# Patient Record
Sex: Female | Born: 1980
Health system: Southern US, Community
[De-identification: ages and names within clinical notes are randomized; demographics above are authoritative.]

## PROBLEM LIST (undated history)

## (undated) DIAGNOSIS — J40 Bronchitis, not specified as acute or chronic: Secondary | ICD-10-CM

## (undated) DIAGNOSIS — R011 Cardiac murmur, unspecified: Secondary | ICD-10-CM

## (undated) DIAGNOSIS — M1990 Unspecified osteoarthritis, unspecified site: Secondary | ICD-10-CM

## (undated) HISTORY — DX: Cardiac murmur, unspecified: R01.1

## (undated) HISTORY — PX: KNEE SURGERY: SHX244

## (undated) HISTORY — PX: KNEE ARTHROSCOPY WITH LATERAL MENISECTOMY: SHX6193

## (undated) HISTORY — DX: Unspecified osteoarthritis, unspecified site: M19.90

---

## 2006-11-06 HISTORY — PX: ARTHROSCOPIC REPAIR ACL: SUR80

## 2013-10-06 ENCOUNTER — Encounter (HOSPITAL_COMMUNITY): Payer: Self-pay | Admitting: Emergency Medicine

## 2013-10-06 ENCOUNTER — Emergency Department (HOSPITAL_COMMUNITY)

## 2013-10-06 ENCOUNTER — Emergency Department (INDEPENDENT_AMBULATORY_CARE_PROVIDER_SITE_OTHER)
Admission: EM | Admit: 2013-10-06 | Discharge: 2013-10-06 | Disposition: A | Discharge status: Home / Self Care | Source: Home / Self Care | Attending: Family Medicine | Admitting: Family Medicine

## 2013-10-06 ENCOUNTER — Emergency Department (HOSPITAL_COMMUNITY)
Admission: EM | Admit: 2013-10-06 | Discharge: 2013-10-07 | Disposition: A | Discharge status: Home / Self Care | Attending: Emergency Medicine | Admitting: Emergency Medicine

## 2013-10-06 DIAGNOSIS — Z79899 Other long term (current) drug therapy: Secondary | ICD-10-CM | POA: Insufficient documentation

## 2013-10-06 DIAGNOSIS — J069 Acute upper respiratory infection, unspecified: Secondary | ICD-10-CM

## 2013-10-06 DIAGNOSIS — R51 Headache: Secondary | ICD-10-CM | POA: Insufficient documentation

## 2013-10-06 HISTORY — DX: Bronchitis, not specified as acute or chronic: J40

## 2013-10-06 MED ORDER — FLUTICASONE PROPIONATE 50 MCG/ACT NA SUSP: 2.0000 | Freq: Every day | NASAL | Status: DC

## 2013-10-06 MED ORDER — HYDROCODONE-HOMATROPINE 5-1.5 MG/5ML PO SYRP: 5.0000 mL | ORAL_SOLUTION | Freq: Four times a day (QID) | ORAL | Status: DC | PRN

## 2013-10-06 MED ORDER — HYDROCOD POLST-CHLORPHEN POLST 10-8 MG/5ML PO LQCR: 5.0000 mL | Freq: Two times a day (BID) | ORAL | Status: DC | PRN

## 2013-10-06 MED ORDER — IPRATROPIUM BROMIDE 0.06 % NA SOLN: 2.0000 | Freq: Four times a day (QID) | NASAL | Status: DC

## 2013-10-06 NOTE — ED Notes (Signed)
Pt has been having a cough for 3 weeks said that she just left urgent care and waited 2 hours to bee seen. Hx of bronchitis. Said her daughter has recently dx with whooping cough.

## 2013-10-06 NOTE — ED Notes (Signed)
Patient room empty, did not speak to staff when leaving

## 2013-10-06 NOTE — ED Provider Notes (Signed)
CSN: [id number]     Arrival date & time 10/06/13  2108 History   First MD Initiated Contact with Patient 10/06/13 2249     This chart was scribed for non-physician practitioner working with Ward Givens, MD by Arlan Organ, ED Scribe. This patient was seen in room WTR5/WTR5 and the patient's care was started at 11:33 PM.   Chief Complaint  Patient presents with  . Cough   The history is provided by the patient. No language interpreter was used.   HPI Comments: Christina Davies is a 32 y.o. Female with a h/o bronchitis who presents to the Emergency Department complaining of a gradual onset, unchanged, new, mild non-productive cough that started 3 weeks ago. She also reports associated chest congestion, rhinorrhea and intermittent frontal sinus HA. Pt states both of her children have been sick in the last few weeks with upper respiratory symptoms. She states she experiences some mild dyspnea after coughing spells. She denies chills, fever, chest pain, syncope, vomiting, and shortness of breath.  Past Medical History  Diagnosis Date  . Bronchitis    Past Surgical History  Procedure Laterality Date  . Cesarean section    . Knee surgery Left    No family history on file. History  Substance Use Topics  . Smoking status: Never Smoker   . Smokeless tobacco: Not on file  . Alcohol Use: No   OB History   Grav Para Term Preterm Abortions TAB SAB Ect Mult Living                  Review of Systems  Respiratory: Positive for cough.   All other systems reviewed and are negative.    Allergies  Review of patient's allergies indicates no known allergies.  Home Medications   Current Outpatient Rx  Name  Route  Sig  Dispense  Refill  . Pseudoephedrine-DM-GG (ROBITUSSIN COLD & COUGH PO)   Oral   Take 10 mLs by mouth every 6 (six) hours as needed. For cough         . chlorpheniramine-HYDROcodone (TUSSIONEX PENNKINETIC ER) 10-8 MG/5ML LQCR   Oral   Take 5 mLs by mouth every 12 (twelve)  hours as needed for cough.   115 mL   1   . fluticasone (FLONASE) 50 MCG/ACT nasal spray   Each Nare   Place 2 sprays into both nostrils daily.   16 g   0   . HYDROcodone-homatropine (HYCODAN) 5-1.5 MG/5ML syrup   Oral   Take 5 mLs by mouth every 6 (six) hours as needed for cough.   120 mL   0   . ipratropium (ATROVENT) 0.06 % nasal spray   Nasal   Place 2 sprays into the nose 4 (four) times daily.   15 mL   1    Triage Vitals: BP 135/89  Pulse 95  Temp(Src) 98.6 F (37 C) (Oral)  SpO2 98%  LMP 09/27/2013  Physical Exam  Nursing note and vitals reviewed. Constitutional: She is oriented to person, place, and time. She appears well-developed and well-nourished. No distress.  HENT:  Head: Normocephalic and atraumatic.  Mouth/Throat: Oropharynx is clear and moist. No oropharyngeal exudate.  Eyes: Conjunctivae and EOM are normal. Pupils are equal, round, and reactive to light. No scleral icterus.  Neck: Normal range of motion.  Cardiovascular: Normal rate, regular rhythm and normal heart sounds.   Pulmonary/Chest: Effort normal and breath sounds normal. No respiratory distress. She has no wheezes. She has no rales.  No retractions or accessory muscle use. Symmetric chest expansion.  Musculoskeletal: Normal range of motion.  Neurological: She is alert and oriented to person, place, and time.  Skin: Skin is warm and dry. No rash noted. She is not diaphoretic. No erythema. No pallor.  Psychiatric: She has a normal mood and affect. Her behavior is normal.    ED Course  Procedures (including critical care time)  DIAGNOSTIC STUDIES: Oxygen Saturation is 98% on RA, Normal by my interpretation.    COORDINATION OF CARE: 11:20 PM- Will order chest X-Ray. Discussed treatment plan with pt at bedside and pt agreed to plan.     Labs Review Labs Reviewed - No data to display  Imaging Review Dg Chest 2 View  10/06/2013   CLINICAL DATA:  Cough and congestion  EXAM: CHEST  2  VIEW  COMPARISON:  None.  FINDINGS: The heart size and mediastinal contours are within normal limits. Both lungs are clear. The visualized skeletal structures are unremarkable.  IMPRESSION: No active cardiopulmonary disease.   Electronically Signed   By: Esperanza Heir M.D.   On: 10/06/2013 22:33    EKG Interpretation   None       MDM   1. Viral URI with cough    32 year old female who presents for viral upper respiratory symptoms with cough x3 weeks. Patient well and nontoxic appearing, hemodynamically stable, and afebrile. Heart regular rate and rhythm and lungs clear bilaterally. No retractions or accessory muscle use appreciated. Chest expansion is symmetric. Chest x-ray today negative for pneumonia or bronchitis; no other acute cardiopulmonary process identified. Patient stable for discharge with primary care followup for further evaluation of her symptoms as needed. Will prescribe Hycodan and Flonase for symptomatic management. Return precautions discussed and patient agreeable to plan with no unaddressed concerns.  I personally performed the services described in this documentation, which was scribed in my presence. The recorded information has been reviewed and is accurate.    Antony Madura, PA-C 10/15/13 [phone]

## 2013-10-06 NOTE — ED Notes (Signed)
Patient is resting comfortably. 

## 2013-10-06 NOTE — ED Notes (Signed)
Toddler son being seen in the same treatment room

## 2013-10-06 NOTE — ED Provider Notes (Signed)
CSN: [id number]     Arrival date & time 10/06/13  1904 History   First MD Initiated Contact with Patient 10/06/13 2036     Chief Complaint  Patient presents with  . Cough  . Otalgia   (Consider location/radiation/quality/duration/timing/severity/associated sxs/prior Treatment) Patient is a 32 y.o. female presenting with cough. The history is provided by the patient.  Cough Cough characteristics:  Non-productive and dry Severity:  Mild Onset quality:  Gradual Duration:  3 weeks Progression:  Unchanged Chronicity:  New Smoker: no   Context: sick contacts and upper respiratory infection   Context comment:  Son similarly sick. Associated symptoms: rhinorrhea   Associated symptoms: no chills, no fever and no shortness of breath     History reviewed. No pertinent past medical history. Past Surgical History  Procedure Laterality Date  . Cesarean section    . Knee surgery Left    No family history on file. History  Substance Use Topics  . Smoking status: Never Smoker   . Smokeless tobacco: Not on file  . Alcohol Use: No   OB History   Grav Para Term Preterm Abortions TAB SAB Ect Mult Living                 Review of Systems  Constitutional: Negative.  Negative for fever and chills.  HENT: Positive for congestion and rhinorrhea.   Eyes: Negative.   Respiratory: Positive for cough. Negative for shortness of breath.   Cardiovascular: Negative.   Gastrointestinal: Negative.   Genitourinary: Negative.     Allergies  Review of patient's allergies indicates no known allergies.  Home Medications   Current Outpatient Rx  Name  Route  Sig  Dispense  Refill  . OVER THE COUNTER MEDICATION      Cough syrup         . chlorpheniramine-HYDROcodone (TUSSIONEX PENNKINETIC ER) 10-8 MG/5ML LQCR   Oral   Take 5 mLs by mouth every 12 (twelve) hours as needed for cough.   115 mL   1   . ipratropium (ATROVENT) 0.06 % nasal spray   Nasal   Place 2 sprays into the nose 4 (four)  times daily.   15 mL   1    BP 131/77  Pulse 98  Temp(Src) 98 F (36.7 C) (Oral)  Resp 20  SpO2 98%  LMP 09/27/2013 Physical Exam  Nursing note and vitals reviewed. Constitutional: She is oriented to person, place, and time. She appears well-developed and well-nourished. No distress.  HENT:  Head: Normocephalic.  Right Ear: External ear normal.  Left Ear: External ear normal.  Mouth/Throat: Oropharynx is clear and moist.  Eyes: Conjunctivae are normal. Pupils are equal, round, and reactive to light.  Neck: Normal range of motion. Neck supple.  Cardiovascular: Normal rate, regular rhythm, normal heart sounds and intact distal pulses.   Pulmonary/Chest: Effort normal and breath sounds normal.  Abdominal: Soft. Bowel sounds are normal. There is no tenderness.  Lymphadenopathy:    She has no cervical adenopathy.  Neurological: She is alert and oriented to person, place, and time.  Skin: Skin is warm and dry.    ED Course  Procedures (including critical care time) Labs Review Labs Reviewed - No data to display Imaging Review No results found.  EKG Interpretation    Date/Time:    Ventricular Rate:    PR Interval:    QRS Duration:   QT Interval:    QTC Calculation:   R Axis:     Text Interpretation:  MDM      Linna Hoff, MD 10/06/13 [phone]

## 2013-10-06 NOTE — ED Notes (Signed)
Coughing, vomiting, ear hurting.  Onset 3 weeks ago.

## 2013-10-15 NOTE — ED Provider Notes (Signed)
Medical screening examination/treatment/procedure(s) were performed by non-physician practitioner and as supervising physician I was immediately available for consultation/collaboration.  EKG Interpretation   None       Devoria Albe, MD, Armando Gang   Ward Givens, MD 10/15/13 [phone]

## 2014-04-25 ENCOUNTER — Ambulatory Visit (INDEPENDENT_AMBULATORY_CARE_PROVIDER_SITE_OTHER): Admitting: Emergency Medicine

## 2014-04-25 VITALS — BP 112/72 | HR 74 | Temp 98.3°F | Resp 18 | Ht 65.0 in | Wt 155.0 lb

## 2014-04-25 DIAGNOSIS — R300 Dysuria: Secondary | ICD-10-CM

## 2014-04-25 DIAGNOSIS — J018 Other acute sinusitis: Secondary | ICD-10-CM

## 2014-04-25 DIAGNOSIS — N3000 Acute cystitis without hematuria: Secondary | ICD-10-CM

## 2014-04-25 LAB — POCT UA - MICROSCOPIC ONLY
CASTS, UR, LPF, POC: NEGATIVE
Crystals, Ur, HPF, POC: NEGATIVE
EPITHELIAL CELLS, URINE PER MICROSCOPY: NEGATIVE
Mucus, UA: NEGATIVE
YEAST UA: NEGATIVE

## 2014-04-25 LAB — POCT URINALYSIS DIPSTICK
Bilirubin, UA: NEGATIVE
Glucose, UA: NEGATIVE
Ketones, UA: NEGATIVE
Nitrite, UA: NEGATIVE
PH UA: 7
Protein, UA: 30
Spec Grav, UA: 1.015
UROBILINOGEN UA: 0.2

## 2014-04-25 MED ORDER — PSEUDOEPHEDRINE-GUAIFENESIN ER 60-600 MG PO TB12: 1.0000 | ORAL_TABLET | Freq: Two times a day (BID) | ORAL | Status: DC

## 2014-04-25 MED ORDER — PROMETHAZINE-CODEINE 6.25-10 MG/5ML PO SYRP: 5.0000 mL | ORAL_SOLUTION | Freq: Four times a day (QID) | ORAL | Status: DC | PRN

## 2014-04-25 MED ORDER — LEVOFLOXACIN 500 MG PO TABS: 500.0000 mg | ORAL_TABLET | Freq: Every day | ORAL | Status: AC

## 2014-04-25 NOTE — Progress Notes (Signed)
[facility] [address], [place] [address] [phone]  Date:  04/25/2014   Name:  Christina DailyMelody H Davies   DOB:  06/14/1981   MRN:  [id number]  PCP:  Mia CreekALBOT, DAVID C, MD    Chief Complaint: Back Pain, burning with urination and cough and congestion   History of Present Illness:  Christina Davies is a 33 y.o. very pleasant female patient who presents with the following:  1 week history of nasal congestion and post nasal drip.  Has a cough that is largely not productive.  No wheezing or shortness of breath.  No nausea or vomiting.  No stool change or rash.  No fever or chills.  Some dizziness at times.  History of sinus infections.  Has lower back pressure and dysuria, urgency and frequency. No vaginal discharge or bleeding.  No improvement with over the counter medications or other home remedies. Denies other complaint or health concern today.   There are no active problems to display for this patient.   Past Medical History  Diagnosis Date  . Bronchitis   . Arthritis   . Heart murmur     Past Surgical History  Procedure Laterality Date  . Cesarean section    . Knee surgery Left     History  Substance Use Topics  . Smoking status: Never Smoker   . Smokeless tobacco: Not on file  . Alcohol Use: No    Family History  Problem Relation Age of Onset  . Hypertension Mother   . Hypertension Father   . Hypertension Maternal Grandmother   . Hypertension Maternal Grandfather   . Hypertension Paternal Grandmother   . Cancer Paternal Grandmother   . Heart disease Paternal Grandmother   . Hypertension Paternal Grandfather     Allergies  Allergen Reactions  . Latex     Medication list has been reviewed and updated.  Current Outpatient Prescriptions on File Prior to Visit  Medication Sig Dispense Refill  . chlorpheniramine-HYDROcodone (TUSSIONEX PENNKINETIC ER) 10-8 MG/5ML LQCR Take 5 mLs by mouth every 12 (twelve) hours as needed for cough.  115 mL   1  . fluticasone (FLONASE) 50 MCG/ACT nasal spray Place 2 sprays into both nostrils Davies.  16 g  0  . HYDROcodone-homatropine (HYCODAN) 5-1.5 MG/5ML syrup Take 5 mLs by mouth every 6 (six) hours as needed for cough.  120 mL  0  . ipratropium (ATROVENT) 0.06 % nasal spray Place 2 sprays into the nose 4 (four) times Davies.  15 mL  1  . Pseudoephedrine-DM-GG (ROBITUSSIN COLD & COUGH PO) Take 10 mLs by mouth every 6 (six) hours as needed. For cough       No current facility-administered medications on file prior to visit.    Review of Systems:  As per HPI, otherwise negative.    Physical Examination: Filed Vitals:   04/25/14 1450  BP: 112/72  Pulse: 74  Temp: 98.3 F (36.8 C)  Resp: 18   Filed Vitals:   04/25/14 1450  Height: 5\' 5"  (1.651 m)  Weight: 155 lb (70.308 kg)   Body mass index is 25.79 kg/(m^2). Ideal Body Weight: Weight in (lb) to have BMI = 25: 149.9  GEN: WDWN, NAD, Non-toxic, A & O x 3 HEENT: Atraumatic, Normocephalic. Neck supple. No masses, No LAD. Ears and Nose: No external deformity. CV: RRR, No M/G/R. No JVD. No thrill. No extra heart sounds. PULM: CTA B, no wheezes, crackles, rhonchi. No retractions. No resp. distress.  No accessory muscle use. ABD: S, NT, ND, +BS. No rebound. No HSM. EXTR: No c/c/e NEURO Normal gait.  PSYCH: Normally interactive. Conversant. Not depressed or anxious appearing.  Calm demeanor.    Assessment and Plan: Sinusitis cystitis levaquin  Signed,  Phillips OdorJeffery Anderson, MD   Results for orders placed in visit on 04/25/14  POCT URINALYSIS DIPSTICK      Result Value Ref Range   Color, UA bright yellow     Clarity, UA slightly cloudy     Glucose, UA neg     Bilirubin, UA neg     Ketones, UA neg     Spec Grav, UA 1.015     Blood, UA trace-intact     pH, UA 7.0     Protein, UA 30     Urobilinogen, UA 0.2     Nitrite, UA neg     Leukocytes, UA small (1+)    POCT UA - MICROSCOPIC ONLY      Result Value Ref Range   WBC,  Ur, HPF, POC 3-5     RBC, urine, microscopic 3-5     Bacteria, U Microscopic trace     Mucus, UA neg     Epithelial cells, urine per micros neg     Crystals, Ur, HPF, POC neg     Casts, Ur, LPF, POC neg     Yeast, UA neg

## 2014-04-25 NOTE — Patient Instructions (Signed)
Sinusitis Sinusitis is redness, soreness, and swelling (inflammation) of the paranasal sinuses. Paranasal sinuses are air pockets within the bones of your face (beneath the eyes, the middle of the forehead, or above the eyes). In healthy paranasal sinuses, mucus is able to drain out, and air is able to circulate through them by way of your nose. However, when your paranasal sinuses are inflamed, mucus and air can become trapped. This can allow bacteria and other germs to grow and cause infection. Sinusitis can develop quickly and last only a short time (acute) or continue over a long period (chronic). Sinusitis that lasts for more than 12 weeks is considered chronic.  CAUSES  Causes of sinusitis include:  Allergies.  Structural abnormalities, such as displacement of the cartilage that separates your nostrils (deviated septum), which can decrease the air flow through your nose and sinuses and affect sinus drainage.  Functional abnormalities, such as when the small hairs (cilia) that line your sinuses and help remove mucus do not work properly or are not present. SYMPTOMS  Symptoms of acute and chronic sinusitis are the same. The primary symptoms are pain and pressure around the affected sinuses. Other symptoms include:  Upper toothache.  Earache.  Headache.  Bad breath.  Decreased sense of smell and taste.  A cough, which worsens when you are lying flat.  Fatigue.  Fever.  Thick drainage from your nose, which often is green and may contain pus (purulent).  Swelling and warmth over the affected sinuses. DIAGNOSIS  Your caregiver will perform a physical exam. During the exam, your caregiver may:  Look in your nose for signs of abnormal growths in your nostrils (nasal polyps).  Tap over the affected sinus to check for signs of infection.  View the inside of your sinuses (endoscopy) with a special imaging device with a light attached (endoscope), which is inserted into your  sinuses. If your caregiver suspects that you have chronic sinusitis, one or more of the following tests may be recommended:  Allergy tests.  Nasal culture--A sample of mucus is taken from your nose and sent to a lab and screened for bacteria.  Nasal cytology--A sample of mucus is taken from your nose and examined by your caregiver to determine if your sinusitis is related to an allergy. TREATMENT  Most cases of acute sinusitis are related to a viral infection and will resolve on their own within 10 days. Sometimes medicines are prescribed to help relieve symptoms (pain medicine, decongestants, nasal steroid sprays, or saline sprays).  However, for sinusitis related to a bacterial infection, your caregiver will prescribe antibiotic medicines. These are medicines that will help kill the bacteria causing the infection.  Rarely, sinusitis is caused by a fungal infection. In theses cases, your caregiver will prescribe antifungal medicine. For some cases of chronic sinusitis, surgery is needed. Generally, these are cases in which sinusitis recurs more than 3 times per year, despite other treatments. HOME CARE INSTRUCTIONS   Drink plenty of water. Water helps thin the mucus so your sinuses can drain more easily.  Use a humidifier.  Inhale steam 3 to 4 times a day (for example, sit in the bathroom with the shower running).  Apply a warm, moist washcloth to your face 3 to 4 times a day, or as directed by your caregiver.  Use saline nasal sprays to help moisten and clean your sinuses.  Take over-the-counter or prescription medicines for pain, discomfort, or fever only as directed by your caregiver. SEEK IMMEDIATE MEDICAL CARE IF:    You have increasing pain or severe headaches.  You have nausea, vomiting, or drowsiness.  You have swelling around your face.  You have vision problems.  You have a stiff neck.  You have difficulty breathing. MAKE SURE YOU:   Understand these  instructions.  Will watch your condition.  Will get help right away if you are not doing well or get worse. Document Released: 10/23/2005 Document Revised: 01/15/2012 Document Reviewed: 11/07/2011 Exit[employer] Patient Information 2015 [employer]. This information is not intended to replace advice given to you by your health care provider. Make sure you discuss any questions you have with your health care provider.  

## 2014-08-27 ENCOUNTER — Ambulatory Visit (INDEPENDENT_AMBULATORY_CARE_PROVIDER_SITE_OTHER): Admitting: Family Medicine

## 2014-08-27 VITALS — BP 128/82 | HR 75 | Temp 98.1°F | Resp 16 | Ht 65.5 in | Wt 171.8 lb

## 2014-08-27 DIAGNOSIS — N898 Other specified noninflammatory disorders of vagina: Secondary | ICD-10-CM

## 2014-08-27 DIAGNOSIS — R300 Dysuria: Secondary | ICD-10-CM

## 2014-08-27 LAB — POCT WET PREP WITH KOH
KOH Prep POC: NEGATIVE
TRICHOMONAS UA: NEGATIVE
Yeast Wet Prep HPF POC: NEGATIVE

## 2014-08-27 LAB — POCT URINALYSIS DIPSTICK
Bilirubin, UA: NEGATIVE
GLUCOSE UA: NEGATIVE
Ketones, UA: NEGATIVE
NITRITE UA: NEGATIVE
Protein, UA: NEGATIVE
RBC UA: NEGATIVE
Spec Grav, UA: 1.02
UROBILINOGEN UA: 0.2
pH, UA: 5.5

## 2014-08-27 LAB — POCT UA - MICROSCOPIC ONLY
CASTS, UR, LPF, POC: NEGATIVE
Crystals, Ur, HPF, POC: NEGATIVE
MUCUS UA: NEGATIVE
Yeast, UA: NEGATIVE

## 2014-08-27 MED ORDER — AZITHROMYCIN 250 MG PO TABS: ORAL_TABLET | ORAL | Status: DC

## 2014-08-27 MED ORDER — CEFTRIAXONE SODIUM 1 G IJ SOLR
500.0000 mg | Freq: Once | INTRAMUSCULAR | Status: AC
  Administered 2014-08-27: 500 mg via INTRAMUSCULAR

## 2014-08-27 NOTE — Progress Notes (Addendum)
Subjective:    Patient ID: Christina Davies, female    DOB: 12/05/1980, 33 y.o.   MRN: [id number]  This chart was scribed for Nilda SimmerKristi Clovia Reine, MD by Jarvis Morganaylor Ferguson, ED Scribe. This patient was seen in Room 8 and the patient's care was started at 8:37 PM.   08/27/2014  Dysuria and Vaginal Discharge   HPI HPI Comments: Christina Davies is a 33 y.o. female who presents to the [facility] complaining of a possible vaginal bacterial infection for 1 week. Pt states she has been having burning dysuria, yellow-green vaginal discharge, mild vaginal irritation, and vaginal bleeding that began today. She states the bleeding is unusual. She notes sometimes she has spotting but has not had the bleeding symptoms with prior vaginal infections.  Pt states yesterday she had a HA with some accompanied nausea but believes it to be unrelated to the vaginal symptoms. Pt is not married. She is dating and states she has a new sexual partner. She does not believe she has an STI. No prior h/o STIs. Her pap smear is UTD. She uses Mirena for her BC method. She had it implanted in April of this year. She denies any vaginal itching, hematuria, fever, vomiting, diarrhea, chills, flank pain.    Review of Systems  Constitutional: Negative for fever and chills.  Gastrointestinal: Positive for nausea. Negative for vomiting and diarrhea.  Genitourinary: Positive for dysuria, vaginal bleeding, vaginal discharge and vaginal pain (mild irritation). Negative for hematuria and flank pain.  Neurological: Positive for headaches.  All other systems reviewed and are negative.   Past Medical History  Diagnosis Date  . Bronchitis   . Arthritis   . Heart murmur    Past Surgical History  Procedure Laterality Date  . Cesarean section    . Knee surgery Left    Allergies  Allergen Reactions  . Latex    Current Outpatient Prescriptions  Medication Sig Dispense Refill  . Butalbital-APAP-Caffeine 50-300-40 MG  CAPS Take by mouth.      . levonorgestrel (MIRENA) 20 MCG/24HR IUD 1 each by Intrauterine route once.      Marland Kitchen. omeprazole (PRILOSEC) 20 MG capsule Take 20 mg by mouth daily.      . ondansetron (ZOFRAN) 4 MG tablet Take 4 mg by mouth every 8 (eight) hours as needed for nausea or vomiting.      Marland Kitchen. azithromycin (ZITHROMAX) 250 MG tablet 4 tablets po x 1  4 tablet  0  . doxycycline (VIBRAMYCIN) 100 MG capsule Take 1 capsule (100 mg total) by mouth 2 (two) times daily.  14 capsule  0   No current facility-administered medications for this visit.       Objective:    Triage Vitals: BP 128/82  Pulse 75  Temp(Src) 98.1 F (36.7 C) (Oral)  Resp 16  Ht 5' 5.5" (1.664 m)  Wt 171 lb 12.8 oz (77.928 kg)  BMI 28.14 kg/m2  SpO2 99%  Physical Exam  Nursing note and vitals reviewed. Constitutional: She is oriented to person, place, and time. She appears well-developed and well-nourished. No distress.  HENT:  Head: Normocephalic and atraumatic.  Eyes: Conjunctivae and EOM are normal.  Neck: Neck supple. No tracheal deviation present.  Cardiovascular: Normal rate.   Pulmonary/Chest: Effort normal. No respiratory distress.  Abdominal: Soft. Bowel sounds are normal. She exhibits no distension. There is no tenderness.  Genitourinary: Cervix exhibits no motion tenderness. Vaginal discharge found.  Moderate amount of yellow white discharge in vaginal  vault.  IUD strings were visualized  Musculoskeletal: Normal range of motion.  Neurological: She is alert and oriented to person, place, and time.  Skin: Skin is warm and dry.  Psychiatric: She has a normal mood and affect. Her behavior is normal.    ROCEPHIN 500MG  IM ADMINISTERED.     Assessment & Plan:   1. Dysuria   2. Discharge from the vagina     Meds ordered this encounter  Medications  . ondansetron (ZOFRAN) 4 MG tablet    Sig: Take 4 mg by mouth every 8 (eight) hours as needed for nausea or vomiting.  . Butalbital-APAP-Caffeine  50-300-40 MG CAPS    Sig: Take by mouth.  Marland Kitchen. azithromycin (ZITHROMAX) 250 MG tablet    Sig: 4 tablets po x 1    Dispense:  4 tablet    Refill:  0  . cefTRIAXone (ROCEPHIN) injection 500 mg    Sig:     Order Specific Question:  Antibiotic Indication:    Answer:  STD  . doxycycline (VIBRAMYCIN) 100 MG capsule    Sig: Take 1 capsule (100 mg total) by mouth 2 (two) times daily.    Dispense:  14 capsule    Refill:  0    No Follow-up on file.   I personally performed the services described in this documentation, which was scribed in my presence.  The recorded information has been reviewed and is accurate.  Nilda SimmerKristi Kendarrius Tanzi, M.D.  [facility]  [place] [address] [place]  [address] [phone] phone [phone] fax

## 2014-08-29 LAB — GC/CHLAMYDIA PROBE AMP
CT PROBE, AMP APTIMA: NEGATIVE
GC PROBE AMP APTIMA: NEGATIVE

## 2014-08-31 LAB — URINE CULTURE: Colony Count: >=100000

## 2014-09-01 ENCOUNTER — Telehealth: Payer: Self-pay | Admitting: Radiology

## 2014-09-01 MED ORDER — DOXYCYCLINE HYCLATE 100 MG PO CAPS: 100.0000 mg | ORAL_CAPSULE | Freq: Two times a day (BID) | ORAL | Status: DC

## 2014-09-01 NOTE — Telephone Encounter (Signed)
Pt calling about lab results. Have not been reviewed yet.

## 2014-09-01 NOTE — Telephone Encounter (Signed)
Labs reviewed and sent to lab pool to contact patient.

## 2015-06-20 ENCOUNTER — Ambulatory Visit (INDEPENDENT_AMBULATORY_CARE_PROVIDER_SITE_OTHER): Payer: BC Managed Care – PPO | Admitting: Physician Assistant

## 2015-06-20 VITALS — BP 128/80 | HR 84 | Temp 98.6°F | Resp 16 | Ht 65.0 in | Wt 157.2 lb

## 2015-06-20 DIAGNOSIS — Z7251 High risk heterosexual behavior: Secondary | ICD-10-CM | POA: Diagnosis not present

## 2015-06-20 DIAGNOSIS — N3000 Acute cystitis without hematuria: Secondary | ICD-10-CM

## 2015-06-20 DIAGNOSIS — R0981 Nasal congestion: Secondary | ICD-10-CM | POA: Diagnosis not present

## 2015-06-20 DIAGNOSIS — M545 Low back pain, unspecified: Secondary | ICD-10-CM

## 2015-06-20 DIAGNOSIS — J029 Acute pharyngitis, unspecified: Secondary | ICD-10-CM

## 2015-06-20 DIAGNOSIS — Z87440 Personal history of urinary (tract) infections: Secondary | ICD-10-CM | POA: Diagnosis not present

## 2015-06-20 LAB — POCT URINALYSIS DIPSTICK
BILIRUBIN UA: NEGATIVE
Glucose, UA: NEGATIVE
KETONES UA: NEGATIVE
Nitrite, UA: NEGATIVE
Protein, UA: NEGATIVE
Spec Grav, UA: 1.015
UROBILINOGEN UA: 0.2
pH, UA: 6

## 2015-06-20 LAB — POCT UA - MICROSCOPIC ONLY
Casts, Ur, LPF, POC: NEGATIVE
Crystals, Ur, HPF, POC: NEGATIVE
Mucus, UA: NEGATIVE
Yeast, UA: NEGATIVE

## 2015-06-20 LAB — POCT WET PREP WITH KOH
Clue Cells Wet Prep HPF POC: NEGATIVE
KOH PREP POC: NEGATIVE
Trichomonas, UA: NEGATIVE
YEAST WET PREP PER HPF POC: NEGATIVE

## 2015-06-20 LAB — POCT RAPID STREP A (OFFICE): Rapid Strep A Screen: NEGATIVE

## 2015-06-20 MED ORDER — NITROFURANTOIN MONOHYD MACRO 100 MG PO CAPS: 100.0000 mg | ORAL_CAPSULE | Freq: Two times a day (BID) | ORAL | Status: DC

## 2015-06-20 NOTE — Patient Instructions (Signed)
You have a uti, please take the macrobid twice daily for 5 days. Your wet prep was normal today. We are checking for chlamydia and gonorrhea and will let you know those results.  Your strep swab was negative.  I think rest, fluids, lozenges, tylenol as needed, and sudafed every 6 hours as needed will help with theses symptoms.  Please come back to see [place] if you're not better in one week.   Urinary Tract Infection Urinary tract infections (UTIs) can develop anywhere along your urinary tract. Your urinary tract is your body's drainage system for removing wastes and extra water. Your urinary tract includes two kidneys, two ureters, a bladder, and a urethra. Your kidneys are a pair of bean-shaped organs. Each kidney is about the size of your fist. They are located below your ribs, one on each side of your spine. CAUSES Infections are caused by microbes, which are microscopic organisms, including fungi, viruses, and bacteria. These organisms are so small that they can only be seen through a microscope. Bacteria are the microbes that most commonly cause UTIs. SYMPTOMS  Symptoms of UTIs may vary by age and gender of the patient and by the location of the infection. Symptoms in young women typically include a frequent and intense urge to urinate and a painful, burning feeling in the bladder or urethra during urination. Older women and men are more likely to be tired, shaky, and weak and have muscle aches and abdominal pain. A fever may mean the infection is in your kidneys. Other symptoms of a kidney infection include pain in your back or sides below the ribs, nausea, and vomiting. DIAGNOSIS To diagnose a UTI, your caregiver will ask you about your symptoms. Your caregiver also will ask to provide a urine sample. The urine sample will be tested for bacteria and white blood cells. White blood cells are made by your body to help fight infection. TREATMENT  Typically, UTIs can be treated with medication.  Because most UTIs are caused by a bacterial infection, they usually can be treated with the use of antibiotics. The choice of antibiotic and length of treatment depend on your symptoms and the type of bacteria causing your infection. HOME CARE INSTRUCTIONS  If you were prescribed antibiotics, take them exactly as your caregiver instructs you. Finish the medication even if you feel better after you have only taken some of the medication.  Drink enough water and fluids to keep your urine clear or pale yellow.  Avoid caffeine, tea, and carbonated beverages. They tend to irritate your bladder.  Empty your bladder often. Avoid holding urine for long periods of time.  Empty your bladder before and after sexual intercourse.  After a bowel movement, women should cleanse from front to back. Use each tissue only once. SEEK MEDICAL CARE IF:   You have back pain.  You develop a fever.  Your symptoms do not begin to resolve within 3 days. SEEK IMMEDIATE MEDICAL CARE IF:   You have severe back pain or lower abdominal pain.  You develop chills.  You have nausea or vomiting.  You have continued burning or discomfort with urination. MAKE SURE YOU:   Understand these instructions.  Will watch your condition.  Will get help right away if you are not doing well or get worse. Document Released: 08/02/2005 Document Revised: 04/23/2012 Document Reviewed: 12/01/2011 [facility] Patient Information 2015 [place], [place]. This information is not intended to replace advice given to you by your health care provider. Make sure you discuss  any questions you have with your health care provider.  

## 2015-06-20 NOTE — Progress Notes (Signed)
Subjective:    Patient ID: Christina Davies, female    DOB: 1981/06/02, 34 y.o.   MRN: [id number]  Chief Complaint  Patient presents with  . Back Pain    lower, x 1 day   . Nasal Congestion    x 2 weeks   Medications, allergies, past medical history, surgical history, family history, social history and problem list reviewed and updated.  HPI  34 yof presents with uri sx and low back pain.   URI sx started approx 2 wks ago. Persistent head/nasal congestion, rhinorrhea, mild cough, st. Mild fatigue. Subjective fevers. Has been working approx 16 hrs day every day past 3 wks. Taking care of 2 small children. Not sleeping much. Noticed white spots right tonsil last week and would like a throat swab.   Also mentions right sided low back pain past 24 hrs. No injury. No new activities. No assoc dysuria, freq, urgency. Had unprotected sex 2 wks ago and is worried about possible std. Her partner was her ex. Denies vaginal dc, odor, or itching.   Declines HIV/RPR testing.   Review of Systems No chills. No abd pain. No n/v, diarrhea.     Objective:   Physical Exam  Constitutional: She appears well-developed and well-nourished.  Non-toxic appearance. She does not have a sickly appearance. She does not appear ill. No distress.  BP 128/80 mmHg  Pulse 84  Temp(Src) 98.6 F (37 C) (Oral)  Resp 16  Ht  (1.651 m)  Wt 157 lb 3.2 oz (71.305 kg)  BMI 26.16 kg/m2  SpO2 99%   HENT:  Right Ear: Tympanic membrane normal.  Left Ear: Tympanic membrane normal.  Nose: Mucosal edema and rhinorrhea present. Right sinus exhibits no maxillary sinus tenderness and no frontal sinus tenderness. Left sinus exhibits no maxillary sinus tenderness and no frontal sinus tenderness.  Mouth/Throat: Uvula is midline and mucous membranes are normal. Oropharyngeal exudate present.  Exudates right tonsil.   Pulmonary/Chest: Effort normal and breath sounds normal.  Abdominal: Soft. Normal appearance and bowel  sounds are normal. There is no tenderness.  Genitourinary: There is no rash on the right labia. There is no rash on the left labia. Uterus is not tender. Cervix exhibits discharge. Cervix exhibits no motion tenderness and no friability. Right adnexum displays no mass, no tenderness and no fullness. Left adnexum displays no mass, no tenderness and no fullness.  Small amnt white thin dc near cervical os. No CMT.   Lymphadenopathy:       Head (right side): No submental, no submandibular and no tonsillar adenopathy present.       Head (left side): No submental, no submandibular and no tonsillar adenopathy present.    She has no cervical adenopathy.       Right: No inguinal adenopathy present.       Left: No inguinal adenopathy present.    Results for orders placed or performed in visit on 06/20/15  POCT urinalysis dipstick  Result Value Ref Range   Color, UA yellow    Clarity, UA clear    Glucose, UA negative    Bilirubin, UA negative    Ketones, UA negative    Spec Grav, UA 1.015    Blood, UA trace-intact    pH, UA 6.0    Protein, UA negative    Urobilinogen, UA 0.2    Nitrite, UA negative    Leukocytes, UA moderate (2+) (A) Negative  POCT UA - Microscopic Only  Result Value Ref Range  WBC, Ur, HPF, POC 5-10    RBC, urine, microscopic 0-3    Bacteria, U Microscopic 3+    Mucus, UA negative    Epithelial cells, urine per micros 1-5    Crystals, Ur, HPF, POC negative    Casts, Ur, LPF, POC negative    Yeast, UA negative   POCT Wet Prep with KOH  Result Value Ref Range   Trichomonas, UA Negative    Clue Cells Wet Prep HPF POC Negative    Epithelial Wet Prep HPF POC Many Few, Moderate, Many   Yeast Wet Prep HPF POC Negative    Bacteria Wet Prep HPF POC Many (A) None, Few   RBC Wet Prep HPF POC Rare    WBC Wet Prep HPF POC 10-20    KOH Prep POC Negative   POCT rapid strep A  Result Value Ref Range   Rapid Strep A Screen Negative Negative      Assessment & Plan:   Sore  throat - Plan: POCT rapid strep A Head congestion --strep swab neg --likely ongoing viral uri as pt is not getting rest  --rest, fluids, lozenges, sudafed prn, tylenol prn --rtc one week if not improved  Right-sided low back pain without sciatica - Plan: POCT urinalysis dipstick, POCT UA - Microscopic Only History of UTI - Plan: POCT urinalysis dipstick, POCT UA - Microscopic Only Unprotected sex - Plan: POCT Wet Prep with KOH, GC/Chlamydia Probe Amp --uti on ua, macrobid --wet prep neg, awaiting gc/ct genprobe --declines hiv/rpr  Donnajean Lopes, PA-C Physician Assistant-Certified [facility]  06/20/2015 1:32 PM

## 2015-06-22 LAB — URINE CULTURE

## 2015-06-22 LAB — CULTURE, GROUP A STREP: ORGANISM ID, BACTERIA: NORMAL

## 2015-06-22 LAB — GC/CHLAMYDIA PROBE AMP
CT Probe RNA: NEGATIVE
GC PROBE AMP APTIMA: NEGATIVE

## 2015-08-29 ENCOUNTER — Ambulatory Visit (INDEPENDENT_AMBULATORY_CARE_PROVIDER_SITE_OTHER): Admitting: Internal Medicine

## 2015-08-29 VITALS — BP 122/70 | HR 73 | Temp 99.5°F | Resp 16 | Ht 64.5 in | Wt 152.5 lb

## 2015-08-29 DIAGNOSIS — J069 Acute upper respiratory infection, unspecified: Secondary | ICD-10-CM | POA: Diagnosis not present

## 2015-08-29 MED ORDER — CETIRIZINE HCL 10 MG PO TABS: 10.0000 mg | ORAL_TABLET | Freq: Every day | ORAL | Status: DC

## 2015-08-29 MED ORDER — HYDROCODONE-HOMATROPINE 5-1.5 MG/5ML PO SYRP: 5.0000 mL | ORAL_SOLUTION | Freq: Four times a day (QID) | ORAL | Status: DC | PRN

## 2015-08-29 NOTE — Progress Notes (Signed)
   Subjective:    Patient ID: Christina Davies, female    DOB: 11/25/1980, 34 y.o.   MRN: [id number]  HPI  Chief Complaint  Patient presents with  . URI    weakness x 2 days.  head congestion, body aches and chills x 2 weeks.    . Cough    white phlegm   Son and daughter with same Also gi upset w/ some diarrhea last 2 days Hx AR seasonal  New teaching position  Review of Systems [place]    Objective:   Physical Exam BP 122/70 mmHg  Pulse 73  Temp(Src) 99.5 F (37.5 C) (Oral)  Resp 16  Ht 5' 4.5" (1.638 m)  Wt 152 lb 8 oz (69.174 kg)  BMI 25.78 kg/m2  SpO2 99%  LMP 08/07/2015 HEENT clear except for boggy turbinates with clear rhinorrhea No lymph nodes Chest clear to auscultation Abdomen benign      Assessment & Plan:  viral URI versus allergic rhinitis -Zyrtec -Fluids

## 2015-10-06 ENCOUNTER — Ambulatory Visit (INDEPENDENT_AMBULATORY_CARE_PROVIDER_SITE_OTHER): Payer: BC Managed Care – PPO | Admitting: Emergency Medicine

## 2015-10-06 VITALS — BP 142/78 | HR 82 | Temp 98.2°F | Resp 18 | Ht 63.0 in | Wt 157.4 lb

## 2015-10-06 DIAGNOSIS — Z111 Encounter for screening for respiratory tuberculosis: Secondary | ICD-10-CM

## 2015-10-06 DIAGNOSIS — B3789 Other sites of candidiasis: Secondary | ICD-10-CM | POA: Diagnosis not present

## 2015-10-06 DIAGNOSIS — G44209 Tension-type headache, unspecified, not intractable: Secondary | ICD-10-CM

## 2015-10-06 MED ORDER — BUTALBITAL-APAP-CAFFEINE 50-325-40 MG PO TABS: 1.0000 | ORAL_TABLET | Freq: Four times a day (QID) | ORAL | Status: AC | PRN

## 2015-10-06 MED ORDER — FLUCONAZOLE 100 MG PO TABS: ORAL_TABLET | ORAL | Status: DC

## 2015-10-06 NOTE — Patient Instructions (Signed)
Tension Headache A tension headache is a feeling of pain, pressure, or aching that is often felt over the front and sides of the head. The pain can be dull, or it can feel tight (constricting). Tension headaches are not normally associated with nausea or vomiting, and they do not get worse with physical activity. Tension headaches can last from 30 minutes to several days. This is the most common type of headache. CAUSES The exact cause of this condition is not known. Tension headaches often begin after stress, anxiety, or depression. Other triggers may include:  Alcohol.  Too much caffeine, or caffeine withdrawal.  Respiratory infections, such as colds, flu, or sinus infections.  Dental problems or teeth clenching.  Fatigue.  Holding your head and neck in the same position for a long period of time, such as while using a computer.  Smoking. SYMPTOMS Symptoms of this condition include:  A feeling of pressure around the head.  Dull, aching head pain.  Pain felt over the front and sides of the head.  Tenderness in the muscles of the head, neck, and shoulders. DIAGNOSIS This condition may be diagnosed based on your symptoms and a physical exam. Tests may be done, such as a CT scan or an MRI of your head. These tests may be done if your symptoms are severe or unusual. TREATMENT This condition may be treated with lifestyle changes and medicines to help relieve symptoms. HOME CARE INSTRUCTIONS Managing Pain  Take over-the-counter and prescription medicines only as told by your health care provider.  Lie down in a dark, quiet room when you have a headache.  If directed, apply ice to the head and neck area:  Put ice in a plastic bag.  Place a towel between your skin and the bag.  Leave the ice on for 20 minutes, 2-3 times per day.  Use a heating pad or a hot shower to apply heat to the head and neck area as told by your health care provider. Eating and Drinking  Eat meals on  a regular schedule.  Limit alcohol use.  Decrease your caffeine intake, or stop using caffeine. General Instructions  Keep all follow-up visits as told by your health care provider. This is important.  Keep a headache journal to help find out what may trigger your headaches. For example, write down:  What you eat and drink.  How much sleep you get.  Any change to your diet or medicines.  Try massage or other relaxation techniques.  Limit stress.  Sit up straight, and avoid tensing your muscles.  Do not use tobacco products, including cigarettes, chewing tobacco, or e-cigarettes. If you need help quitting, ask your health care provider.  Exercise regularly as told by your health care provider.  Get 7-9 hours of sleep, or the amount recommended by your health care provider. SEEK MEDICAL CARE IF:  Your symptoms are not helped by medicine.  You have a headache that is different from what you normally experience.  You have nausea or you vomit.  You have a fever. SEEK IMMEDIATE MEDICAL CARE IF:  Your headache becomes severe.  You have repeated vomiting.  You have a stiff neck.  You have a loss of vision.  You have problems with speech.  You have pain in your eye or ear.  You have muscular weakness or loss of muscle control.  You lose your balance or you have trouble walking.  You feel faint or you pass out.  You have confusion.     This information is not intended to replace advice given to you by your health care provider. Make sure you discuss any questions you have with your health care provider.   Document Released: 10/23/2005 Document Revised: 07/14/2015 Document Reviewed: 02/15/2015 Elsevier Interactive Patient Education 2016 [employer].  

## 2015-10-06 NOTE — Progress Notes (Signed)
Subjective:  Patient ID: Christina Davies, female    DOB: 11/26/1980  Age: 34 y.o. MRN: [id number]  CC: Hypertension; Immunizations; and Rash   HPI Christina Davies presents  she has number of complaints. She has a history of hypertension and took medicine for blood pressure some time in the past but stopped it. She is concerned that she is having increasing headaches associated with stress of starting in his job. She is concerned that they may be related to hypertension. N0 neurologic or visual symptoms.  She also has been taking Augmentin for several days and since starting the antibiotic and was developing a general rash. It's rather itchy and red and extends between her buttocks down around her vulva and in her inguinal region she has no vaginal discharge she's finishing her period now.  She also needs a TB screen skin test  History Christina Davies has a past medical history of Bronchitis; Arthritis; and Heart murmur.   She has past surgical history that includes Cesarean section and Knee surgery (Left).   Her  family history includes Cancer in her paternal grandmother; Heart disease in her paternal grandmother; Hypertension in her father, maternal grandfather, maternal grandmother, mother, paternal grandfather, and paternal grandmother.  She   reports that she has never smoked. She does not have any smokeless tobacco history on file. She reports that she does not drink alcohol or use illicit drugs.  Outpatient Prescriptions Prior to Visit  Medication Sig Dispense Refill  . Butalbital-APAP-Caffeine 50-300-40 MG CAPS Take by mouth.    Lorita Officer. Norgestim-Eth Estrad Triphasic (TRI-PREVIFEM PO) Take by mouth.    . cetirizine (ZYRTEC) 10 MG tablet Take 1 tablet (10 mg total) by mouth daily. (Patient not taking: Reported on 10/06/2015) 30 tablet 11  . HYDROcodone-homatropine (HYCODAN) 5-1.5 MG/5ML syrup Take 5 mLs by mouth every 6 (six) hours as needed. (Patient not taking: Reported on 10/06/2015) 120  mL 0  . nitrofurantoin, macrocrystal-monohydrate, (MACROBID) 100 MG capsule Take 1 capsule (100 mg total) by mouth 2 (two) times daily. (Patient not taking: Reported on 10/06/2015) 10 capsule 0  . omeprazole (PRILOSEC) 20 MG capsule Take 20 mg by mouth daily.    . ondansetron (ZOFRAN) 4 MG tablet Take 4 mg by mouth every 8 (eight) hours as needed for nausea or vomiting.     No facility-administered medications prior to visit.    Social History   Social History  . Marital Status: Legally Separated    Spouse Name: N/A  . Number of Children: N/A  . Years of Education: N/A   Social History Main Topics  . Smoking status: Never Smoker   . Smokeless tobacco: None  . Alcohol Use: No  . Drug Use: No  . Sexual Activity: Not Asked   Other Topics Concern  . None   Social History Narrative     Review of Systems  Constitutional: Negative for fever, chills and appetite change.  HENT: Negative for congestion, ear pain, postnasal drip, sinus pressure and sore throat.   Eyes: Negative for pain and redness.  Respiratory: Negative for cough, shortness of breath and wheezing.   Cardiovascular: Negative for leg swelling.  Gastrointestinal: Negative for nausea, vomiting, abdominal pain, diarrhea, constipation and blood in stool.  Endocrine: Negative for polyuria.  Genitourinary: Negative for dysuria, urgency, frequency and flank pain.  Musculoskeletal: Negative for gait problem.  Skin: Positive for rash.  Neurological: Positive for headaches. Negative for weakness.  Psychiatric/Behavioral: Negative for confusion and decreased concentration. The patient is  not nervous/anxious.     Objective:  BP 142/78 mmHg  Pulse 82  Temp(Src) 98.2 F (36.8 C) (Oral)  Resp 18  Ht  (1.6 m)  Wt 157 lb 6.4 oz (71.396 kg)  BMI 27.89 kg/m2  SpO2 99%  LMP 10/04/2015  Physical Exam  Constitutional: She is oriented to person, place, and time. She appears well-developed and well-nourished. No distress.   HENT:  Head: Normocephalic and atraumatic.  Right Ear: External ear normal.  Left Ear: External ear normal.  Nose: Nose normal.  Eyes: Conjunctivae and EOM are normal. Pupils are equal, round, and reactive to light. No scleral icterus.  Neck: Normal range of motion. Neck supple. No tracheal deviation present.  Cardiovascular: Normal rate, regular rhythm and normal heart sounds.   Pulmonary/Chest: Effort normal. No respiratory distress. She has no wheezes. She has no rales.  Abdominal: She exhibits no mass. There is no tenderness. There is no rebound and no guarding.  Genitourinary: There is rash on the right labia. There is rash on the left labia.  Musculoskeletal: She exhibits no edema.  Lymphadenopathy:    She has no cervical adenopathy.  Neurological: She is alert and oriented to person, place, and time. Coordination normal.  Skin: Skin is warm and dry. No rash noted.  Psychiatric: She has a normal mood and affect. Her behavior is normal.      Assessment & Plan:   Christina Davies was seen today for hypertension, immunizations and rash.  Diagnoses and all orders for this visit:  Tension headache  Screening for tuberculosis -     TB Skin Test  Candida rash of groin  Other orders -     fluconazole (DIFLUCAN) 100 MG tablet; Two now and one daily -     butalbital-acetaminophen-caffeine (FIORICET) 50-325-40 MG tablet; Take 1-2 tablets by mouth every 6 (six) hours as needed for headache.   I am having Ms. Davies start on fluconazole and butalbital-acetaminophen-caffeine. I am also having her maintain her omeprazole, ondansetron, Butalbital-APAP-Caffeine, Norgestim-Eth Estrad Triphasic (TRI-PREVIFEM PO), nitrofurantoin (macrocrystal-monohydrate), cetirizine, HYDROcodone-homatropine, and amoxicillin-clavulanate.  Meds ordered this encounter  Medications  . amoxicillin-clavulanate (AUGMENTIN) 875-125 MG tablet    Sig: Take 1 tablet by mouth 2 (two) times daily.  . fluconazole  (DIFLUCAN) 100 MG tablet    Sig: Two now and one daily    Dispense:  15 tablet    Refill:  0  . butalbital-acetaminophen-caffeine (FIORICET) 50-325-40 MG tablet    Sig: Take 1-2 tablets by mouth every 6 (six) hours as needed for headache.    Dispense:  60 tablet    Refill:  1    Appropriate red flag conditions were discussed with the patient as well as actions that should be taken.  Patient expressed his understanding.  Follow-up: Return if symptoms worsen or fail to improve.  Carmelina Dane, MD

## 2015-10-08 ENCOUNTER — Encounter

## 2015-10-08 DIAGNOSIS — Z111 Encounter for screening for respiratory tuberculosis: Secondary | ICD-10-CM

## 2015-10-08 LAB — TB SKIN TEST
Induration: 0 mm
TB SKIN TEST: NEGATIVE

## 2016-01-05 ENCOUNTER — Ambulatory Visit (INDEPENDENT_AMBULATORY_CARE_PROVIDER_SITE_OTHER): Payer: BC Managed Care – PPO | Admitting: Physician Assistant

## 2016-01-05 ENCOUNTER — Ambulatory Visit (INDEPENDENT_AMBULATORY_CARE_PROVIDER_SITE_OTHER): Payer: BC Managed Care – PPO

## 2016-01-05 VITALS — BP 126/84 | HR 82 | Temp 98.6°F | Resp 16 | Ht 64.25 in | Wt 162.4 lb

## 2016-01-05 DIAGNOSIS — R05 Cough: Secondary | ICD-10-CM | POA: Diagnosis not present

## 2016-01-05 DIAGNOSIS — R059 Cough, unspecified: Secondary | ICD-10-CM

## 2016-01-05 DIAGNOSIS — R0602 Shortness of breath: Secondary | ICD-10-CM | POA: Diagnosis not present

## 2016-01-05 MED ORDER — HYDROCOD POLST-CPM POLST ER 10-8 MG/5ML PO SUER: 5.0000 mL | Freq: Every evening | ORAL | Status: AC | PRN

## 2016-01-05 MED ORDER — GUAIFENESIN ER 1200 MG PO TB12: 1.0000 | ORAL_TABLET | Freq: Two times a day (BID) | ORAL | Status: DC | PRN

## 2016-01-05 MED ORDER — BENZONATATE 100 MG PO CAPS: 100.0000 mg | ORAL_CAPSULE | Freq: Three times a day (TID) | ORAL | Status: DC | PRN

## 2016-01-05 NOTE — Progress Notes (Signed)
Subjective:    Patient ID: Christina Davies, female    DOB: [birth year], 35 y.o.   MRN: [id number]   Chief Complaint  Patient presents with  . Cough    sin. drainage , ST - yellowish- green sputum  x 4 days  . Shortness of Breath    HPI  Christina Davies is a 35 year old caucasian female who presents today with a 4 day history of cough. Cough is associated with yellow/green rhinorrhea, some nasal congestion, sinus pressure with headaches, and PND.  Cough started being productive yesterday with yellow/green mucous.  Coughing so hard last night it lead to vomiting x2 consistent of yellow/green mucous. She states she coughs so hard her chest hurts. Describes it as a heavy pressure or weight on chest which has made her feel SOB at times and causes her to wheeze. Has tried saline spray and OTC cough medicine with little relief. Denies fever, chills, body aches, or ear pain.  Has a long history of bronchitis. States she gets bronchitis at least once a year and has for the last several years.   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   Would like a chest xray to make sure nothing has gone to her lungs.   Allergies  Allergen Reactions  . Latex    Prior to Admission medications   Medication Sig Start Date End Date Taking? Authorizing Provider  butalbital-acetaminophen-caffeine (FIORICET) 50-325-40 MG tablet Take 1-2 tablets by mouth every 6 (six) hours as needed for headache. 10/06/15 10/05/16 Yes Carmelina Dane, MD  Butalbital-APAP-Caffeine 50-300-40 MG CAPS Take by mouth.   Yes Historical Provider, MD  Norgestim-Eth Estrad Triphasic (TRI-PREVIFEM PO) Take by mouth.   Yes Historical Provider, MD  ondansetron (ZOFRAN) 4 MG tablet Take 4 mg by mouth every 8 (eight) hours as needed for nausea or vomiting.   Yes Historical Provider, MD  omeprazole (PRILOSEC) 20 MG capsule Take 20 mg by mouth  daily. Reported on 01/05/2016    Historical Provider, MD   PMH, FH, and SH were all reviewed with patient and updated as needed.  Review of Systems  Constitutional: Positive for diaphoresis and fatigue. Negative for fever and chills.  HENT: Positive for congestion, postnasal drip, rhinorrhea, sinus pressure and sore throat. Negative for ear pain, hearing loss and sneezing.   Eyes: Negative for photophobia.  Respiratory: Positive for cough, shortness of breath and wheezing. Negative for chest tightness.   Cardiovascular: Positive for chest pain (pressure).  Gastrointestinal: Positive for nausea and vomiting. Negative for abdominal pain, diarrhea and constipation.  Genitourinary: Negative for dysuria and urgency.  Musculoskeletal: Negative for myalgias.  Neurological: Positive for headaches. Negative for dizziness and light-headedness.      Objective:   Physical Exam  Constitutional: She is oriented to person, place, and time. She appears well-developed and well-nourished.  Blood pressure 126/84, pulse 82, temperature 98.6 F (37 C), temperature source Oral, resp. rate 16, height 5' 4.25" (1.632 m), weight 162 lb 6.4 oz (73.664 kg), last menstrual period 12/08/2015, SpO2 99 %.   HENT:  Head: Normocephalic and atraumatic.  Right Ear: Hearing, tympanic membrane, external ear and ear canal normal.  Left Ear: Hearing, tympanic membrane, external ear and ear canal normal.  Nose: Rhinorrhea present.  Eyes: Conjunctivae and EOM are normal. Pupils are equal, round, and reactive to light. No scleral icterus.  Neck: Normal range of  motion. Neck supple.  Cardiovascular: Normal rate, regular rhythm, S1 normal, S2 normal and normal heart sounds.  Exam reveals no gallop and no friction rub.   No murmur heard. Pulses:      Radial pulses are 2+ on the right side, and 2+ on the left side.       Dorsalis pedis pulses are 2+ on the right side, and 2+ on the left side.  Pulmonary/Chest: Effort normal. She  has no wheezes. She has no rales. She exhibits tenderness.    Lymphadenopathy:       Head (right side): No submental, no submandibular, no tonsillar, no preauricular, no posterior auricular and no occipital adenopathy present.       Head (left side): No submental, no submandibular, no tonsillar, no preauricular, no posterior auricular and no occipital adenopathy present.    She has no cervical adenopathy.       Right: No supraclavicular adenopathy present.       Left: No supraclavicular adenopathy present.  Neurological: She is alert and oriented to person, place, and time. She has normal strength. No sensory deficit.  Skin: Skin is warm and dry. No erythema.  Psychiatric: She has a normal mood and affect. Her behavior is normal. Thought content normal.   Dg Chest 2 View  01/05/2016  CLINICAL DATA:  Shortness of breath for 4 days, cough EXAM: CHEST  2 VIEW COMPARISON:  None FINDINGS: Normal heart size, mediastinal contours, and pulmonary vascularity. Lungs clear. No pneumothorax. Bones unremarkable. IMPRESSION: Normal exam. Electronically Signed   By: Ulyses Southward M.D.   On: 01/05/2016 16:55      Assessment & Plan:  1. Cough CXR is clear. Most likely a viral URI. Symptomatic relief.  - DG Chest 2 View; Future - benzonatate (TESSALON) 100 MG capsule; Take 1-2 capsules (100-200 mg total) by mouth 3 (three) times daily as needed for cough.  Dispense: 40 capsule; Refill: 0 - chlorpheniramine-HYDROcodone (TUSSIONEX PENNKINETIC ER) 10-8 MG/5ML SUER; Take 5 mLs by mouth at bedtime as needed for cough.  Dispense: 50 mL; Refill: 0 - Guaifenesin (MUCINEX MAXIMUM STRENGTH) 1200 MG TB12; Take 1 tablet (1,200 mg total) by mouth every 12 (twelve) hours as needed.  Dispense: 14 tablet; Refill: 1  2. SOB (shortness of breath)  She most likely has a viral URI that will resolve on its own. Will prescribe tessalon, tussionex, and mucinex to help provide symptomatic relief. Instructed her to reutn if symptoms  get worse or do not start to improve. Discussed medications and possible side effects. Provided a work note for today. Discussed assessment and plan with patient. She expressed her understanding and agreement with the plan.   Hilton Cork PA-S [facility]

## 2016-01-05 NOTE — Patient Instructions (Addendum)
Because you received an x-ray today, you will receive an invoice from [facility]. Please contact [facility] at [phone] with questions or concerns regarding your invoice. Our billing staff will not be able to assist you with those questions.   Upper Respiratory Infection, Adult Most upper respiratory infections (URIs) are a viral infection of the air passages leading to the lungs. A URI affects the nose, throat, and upper air passages. The most common type of URI is nasopharyngitis and is typically referred to as "the common cold." URIs run their course and usually go away on their own. Most of the time, a URI does not require medical attention, but sometimes a bacterial infection in the upper airways can follow a viral infection. This is called a secondary infection. Sinus and middle ear infections are common types of secondary upper respiratory infections. Bacterial pneumonia can also complicate a URI. A URI can worsen asthma and chronic obstructive pulmonary disease (COPD). Sometimes, these complications can require emergency medical care and may be life threatening.  CAUSES Almost all URIs are caused by viruses. A virus is a type of germ and can spread from one person to another.  RISKS FACTORS You may be at risk for a URI if:   You smoke.   You have chronic heart or lung disease.  You have a weakened defense (immune) system.   You are very young or very old.   You have nasal allergies or asthma.  You work in crowded or poorly ventilated areas.  You work in health care facilities or schools. SIGNS AND SYMPTOMS  Symptoms typically develop 2-3 days after you come in contact with a cold virus. Most viral URIs last 7-10 days. However, viral URIs from the influenza virus (flu virus) can last 14-18 days and are typically more severe. Symptoms may include:   Runny or stuffy (congested) nose.   Sneezing.   Cough.   Sore throat.   Headache.   Fatigue.    Fever.   Loss of appetite.   Pain in your forehead, behind your eyes, and over your cheekbones (sinus pain).  Muscle aches.  DIAGNOSIS  Your health care provider may diagnose a URI by:  Physical exam.  Tests to check that your symptoms are not due to another condition such as:  Strep throat.  Sinusitis.  Pneumonia.  Asthma. TREATMENT  A URI goes away on its own with time. It cannot be cured with medicines, but medicines may be prescribed or recommended to relieve symptoms. Medicines may help:  Reduce your fever.  Reduce your cough.  Relieve nasal congestion. HOME CARE INSTRUCTIONS   Take medicines only as directed by your health care provider.   Gargle warm saltwater or take cough drops to comfort your throat as directed by your health care provider.  Use a warm mist humidifier or inhale steam from a shower to increase air moisture. This may make it easier to breathe.  Drink enough fluid to keep your urine clear or pale yellow.   Eat soups and other clear broths and maintain good nutrition.   Rest as needed.   Return to work when your temperature has returned to normal or as your health care provider advises. You may need to stay home longer to avoid infecting others. You can also use a face mask and careful hand washing to prevent spread of the virus.  Increase the usage of your inhaler if you have asthma.   Do not use any tobacco products, including cigarettes, chewing  tobacco, or electronic cigarettes. If you need help quitting, ask your health care provider. PREVENTION  The best way to protect yourself from getting a cold is to practice good hygiene.   Avoid oral or hand contact with people with cold symptoms.   Wash your hands often if contact occurs.  There is no clear evidence that vitamin C, vitamin E, echinacea, or exercise reduces the chance of developing a cold. However, it is always recommended to get plenty of rest, exercise, and  practice good nutrition.  SEEK MEDICAL CARE IF:   You are getting worse rather than better.   Your symptoms are not controlled by medicine.   You have chills.  You have worsening shortness of breath.  You have brown or red mucus.  You have yellow or brown nasal discharge.  You have pain in your face, especially when you bend forward.  You have a fever.  You have swollen neck glands.  You have pain while swallowing.  You have white areas in the back of your throat. SEEK IMMEDIATE MEDICAL CARE IF:   You have severe or persistent:  Headache.  Ear pain.  Sinus pain.  Chest pain.  You have chronic lung disease and any of the following:  Wheezing.  Prolonged cough.  Coughing up blood.  A change in your usual mucus.  You have a stiff neck.  You have changes in your:  Vision.  Hearing.  Thinking.  Mood. MAKE SURE YOU:   Understand these instructions.  Will watch your condition.  Will get help right away if you are not doing well or get worse.   This information is not intended to replace advice given to you by your health care provider. Make sure you discuss any questions you have with your health care provider.   Document Released: 04/18/2001 Document Revised: 03/09/2015 Document Reviewed: 01/28/2014 Elsevier Interactive Patient Education [employer]

## 2016-01-05 NOTE — Progress Notes (Signed)
Patient ID: Christina Davies, female    DOB: 07/23/1981, 35 y.o.   MRN: [id number]  PCP: Mia Creek, MD  Subjective:   Chief Complaint  Patient presents with  . Cough    sin. drainage , ST - yellowish- green sputum  x 4 days  . Shortness of Breath    HPI Presents for evaluation of a 4 day history of cough.   Cough is associated with yellow/green rhinorrhea, some nasal congestion, sinus pressure with headaches, and PND. Cough started being productive yesterday with yellow/green mucous. Coughing so hard last night it lead to vomiting x2 consistent of yellow/green mucous. She states she coughs so hard her chest hurts. Describes it as a heavy pressure or weight on chest which has made her feel SOB at times and causes her to wheeze. Has tried saline spray and OTC cough medicine with little relief. Denies fever, chills, body aches, or ear pain.  Has a long history of bronchitis. States she gets bronchitis at least once a year and has for the last several years.   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   Would like a chest xray "to make sure nothing has gone to her lungs."  Review of Systems Constitutional: Positive for diaphoresis and fatigue. Negative for fever and chills.  HENT: Positive for congestion, postnasal drip, rhinorrhea, sinus pressure and sore throat. Negative for ear pain, hearing loss and sneezing.  Eyes: Negative for photophobia.  Respiratory: Positive for cough, shortness of breath and wheezing. Negative for chest tightness.  Cardiovascular: Positive for chest pain (pressure).  Gastrointestinal: Positive for nausea and vomiting. Negative for abdominal pain, diarrhea and constipation.  Genitourinary: Negative for dysuria and urgency.  Musculoskeletal: Negative for myalgias.  Neurological: Positive for headaches. Negative for dizziness and light-headedness.      There are no active problems to display for this patient.    Prior to Admission medications   Medication Sig Start Date End Date Taking? Authorizing Provider  butalbital-acetaminophen-caffeine (FIORICET) 50-325-40 MG tablet Take 1-2 tablets by mouth every 6 (six) hours as needed for headache. 10/06/15 10/05/16 Yes Carmelina Dane, MD  Butalbital-APAP-Caffeine 50-300-40 MG CAPS Take by mouth.   Yes Historical Provider, MD  ondansetron (ZOFRAN) 4 MG tablet Take 4 mg by mouth every 8 (eight) hours as needed for nausea or vomiting.   Yes Historical Provider, MD  omeprazole (PRILOSEC) 20 MG capsule Take 20 mg by mouth daily. Reported on 01/05/2016    Historical Provider, MD  pantoprazole (PROTONIX) 40 MG tablet  11/04/15   Historical Provider, MD  TRI-LINYAH 0.18/0.215/0.25 MG-35 MCG tablet  12/14/15   Historical Provider, MD     Allergies  Allergen Reactions  . Latex        Objective:  Physical Exam  Constitutional: She is oriented to person, place, and time. She appears well-developed and well-nourished. She is active and cooperative. No distress.  BP 126/84 mmHg  Pulse 82  Temp(Src) 98.6 F (37 C) (Oral)  Resp 16  Ht 5' 4.25" (1.632 m)  Wt 162 lb 6.4 oz (73.664 kg)  BMI 27.66 kg/m2  SpO2 99%  LMP 12/08/2015  HENT:  Head: Normocephalic and atraumatic.  Right Ear: Hearing normal.  Left Ear: Hearing normal.  Eyes: Conjunctivae are normal. No scleral icterus.  Neck: Normal range of motion. Neck supple. No thyromegaly present.  Cardiovascular: Normal rate, regular rhythm and  normal heart sounds.   Pulses:      Radial pulses are 2+ on the right side, and 2+ on the left side.  Pulmonary/Chest: Effort normal and breath sounds normal.  Lymphadenopathy:       Head (right side): No tonsillar, no preauricular, no posterior auricular and no occipital adenopathy present.       Head (left side): No tonsillar, no preauricular, no posterior auricular and no occipital adenopathy  present.    She has no cervical adenopathy.       Right: No supraclavicular adenopathy present.       Left: No supraclavicular adenopathy present.  Neurological: She is alert and oriented to person, place, and time. No sensory deficit.  Skin: Skin is warm, dry and intact. No rash noted. No cyanosis or erythema. Nails show no clubbing.  Psychiatric: She has a normal mood and affect. Her speech is normal and behavior is normal.       Dg Chest 2 View  01/05/2016  CLINICAL DATA:  Shortness of breath for 4 days, cough EXAM: CHEST  2 VIEW COMPARISON:  None FINDINGS: Normal heart size, mediastinal contours, and pulmonary vascularity. Lungs clear. No pneumothorax. Bones unremarkable. IMPRESSION: Normal exam. Electronically Signed   By: Ulyses Southward M.D.   On: 01/05/2016 16:55       Assessment & Plan:   1. Cough 2. SOB (shortness of breath) Likely viral URI. Reassuring CXR. Supportive care.  Anticipatory guidance.  RTC if symptoms worsen/persist. - DG Chest 2 View; Future - benzonatate (TESSALON) 100 MG capsule; Take 1-2 capsules (100-200 mg total) by mouth 3 (three) times daily as needed for cough.  Dispense: 40 capsule; Refill: 0 - chlorpheniramine-HYDROcodone (TUSSIONEX PENNKINETIC ER) 10-8 MG/5ML SUER; Take 5 mLs by mouth at bedtime as needed for cough.  Dispense: 50 mL; Refill: 0 - Guaifenesin (MUCINEX MAXIMUM STRENGTH) 1200 MG TB12; Take 1 tablet (1,200 mg total) by mouth every 12 (twelve) hours as needed.  Dispense: 14 tablet; Refill: 1    Fernande Bras, PA-C Physician Assistant-Certified [facility]

## 2016-03-21 ENCOUNTER — Ambulatory Visit (INDEPENDENT_AMBULATORY_CARE_PROVIDER_SITE_OTHER): Payer: BC Managed Care – PPO | Admitting: Family Medicine

## 2016-03-21 ENCOUNTER — Ambulatory Visit (INDEPENDENT_AMBULATORY_CARE_PROVIDER_SITE_OTHER): Payer: BC Managed Care – PPO

## 2016-03-21 VITALS — BP 120/86 | HR 73 | Temp 98.0°F | Resp 16 | Ht 64.5 in | Wt 159.0 lb

## 2016-03-21 DIAGNOSIS — S99912A Unspecified injury of left ankle, initial encounter: Secondary | ICD-10-CM | POA: Insufficient documentation

## 2016-03-21 NOTE — Assessment & Plan Note (Signed)
[place] Ankle Rules + so will obtain 3 view ankle to r/o fx - If negative, ASO and ice/nsaids with f/u in 1 week for possible PT eval/tx since she is going to try to condition [employer] as she is an active member.

## 2016-03-21 NOTE — Patient Instructions (Signed)
     IF you received an x-ray today, you will receive an invoice from [facility]. Please contact [facility] at [phone] with questions or concerns regarding your invoice.   IF you received labwork today, you will receive an invoice from [facility]. Please contact [facility] at [phone] with questions or concerns regarding your invoice.   Our billing staff will not be able to assist you with questions regarding bills from these companies.  You will be contacted with the lab results as soon as they are available. The fastest way to get your results is to activate your My Chart account. Instructions are located on the last page of this paperwork. If you have not heard from us regarding the results in 2 weeks, please contact this office.      

## 2016-03-21 NOTE — Progress Notes (Signed)
  Christina Davies - 35 y.o. female MRN [id number]  Date of birth: 09/21/1981 Christina Davies is a 35 y.o. female who presents today for L ankle injury.  L ankle injury, initial visit - Happened this AM when she rolled her ankle by stepping down the stairs.  Denies feeling pop but she has had some swelling.  Able to walk but with a limp.  No previous injury to the L ankle but has had L knee ACL reconstruction and menisectomy   PMHx - Updated and reviewed.  Contributory factors include: OA L knee  PSHx - Updated and reviewed.  Contributory factors include:  L knee ACL reconstruction 2008  FHx - Updated and reviewed.  Contributory factors include:  HTN MA/FA Social Hx - Updated and reviewed. Contributory factors include: Non smoker  Medications - Updated/reviewed    ROS Per HPI.  12 point negative other than per HPI.   Exam:  Filed Vitals:   03/21/16 1742  BP: 120/86  Pulse: 73  Temp: 98 F (36.7 C)  Resp: 16   Gen: NAD, AAO 3 Cardio- RRR Pulm - Normal respiratory effort/rate Skin: No rashes or erythema Extremities: No edema  Vascular: pulses +2 bilateral upper and lower extremity Psych: Normal affect  MSK: L Ankle: No visible erythema or swelling. Range of motion is full in all directions. Strength is 5/5 in all directions. Stable lateral and medial ligaments; squeeze test and kleiger test unremarkable; Talar dome nontender; No pain at base of 5th MT; No tenderness over cuboid; No tenderness over N spot or navicular prominence + tenderness on posterior aspects of lateral and medial malleolus No sign of peroneal tendon subluxations; Negative tarsal tunnel tinel's Able to walk 4 steps.  Imaging:  L ankle 3 view -  Neg for acute fx

## 2016-03-30 ENCOUNTER — Ambulatory Visit (INDEPENDENT_AMBULATORY_CARE_PROVIDER_SITE_OTHER): Payer: BC Managed Care – PPO | Admitting: Family Medicine

## 2016-03-30 VITALS — BP 124/80 | HR 68 | Temp 98.0°F | Resp 16 | Ht 64.5 in | Wt 163.0 lb

## 2016-03-30 DIAGNOSIS — N898 Other specified noninflammatory disorders of vagina: Secondary | ICD-10-CM

## 2016-03-30 DIAGNOSIS — N760 Acute vaginitis: Secondary | ICD-10-CM

## 2016-03-30 DIAGNOSIS — N9489 Other specified conditions associated with female genital organs and menstrual cycle: Secondary | ICD-10-CM | POA: Diagnosis not present

## 2016-03-30 DIAGNOSIS — A499 Bacterial infection, unspecified: Secondary | ICD-10-CM | POA: Diagnosis not present

## 2016-03-30 DIAGNOSIS — S96912D Strain of unspecified muscle and tendon at ankle and foot level, left foot, subsequent encounter: Secondary | ICD-10-CM

## 2016-03-30 DIAGNOSIS — B9689 Other specified bacterial agents as the cause of diseases classified elsewhere: Secondary | ICD-10-CM

## 2016-03-30 LAB — POCT URINALYSIS DIP (MANUAL ENTRY)
BILIRUBIN UA: NEGATIVE
BILIRUBIN UA: NEGATIVE
Blood, UA: NEGATIVE
Glucose, UA: NEGATIVE
LEUKOCYTES UA: NEGATIVE
Nitrite, UA: NEGATIVE
PROTEIN UA: NEGATIVE
Spec Grav, UA: 1.01
Urobilinogen, UA: 0.2
pH, UA: 7

## 2016-03-30 LAB — POCT WET + KOH PREP
TRICH BY WET PREP: ABSENT
YEAST BY KOH: ABSENT
YEAST BY WET PREP: ABSENT

## 2016-03-30 LAB — POC MICROSCOPIC URINALYSIS (UMFC): MUCUS RE: ABSENT

## 2016-03-30 MED ORDER — METRONIDAZOLE 500 MG PO TABS: 500.0000 mg | ORAL_TABLET | Freq: Two times a day (BID) | ORAL | Status: AC

## 2016-03-30 NOTE — Patient Instructions (Addendum)
   IF you received an x-ray today, you will receive an invoice from [facility]. Please contact [facility] at [phone] with questions or concerns regarding your invoice.   IF you received labwork today, you will receive an invoice from [facility]. Please contact [facility] at [phone] with questions or concerns regarding your invoice.   Our billing staff will not be able to assist you with questions regarding bills from these companies.  You will be contacted with the lab results as soon as they are available. The fastest way to get your results is to activate your My Chart account. Instructions are located on the last page of this paperwork. If you have not heard from us regarding the results in 2 weeks, please contact this office.     Acute Ankle Sprain With Phase I Rehab An acute ankle sprain is a partial or complete tear in one or more of the ligaments of the ankle due to traumatic injury. The severity of the injury depends on both the number of ligaments sprained and the grade of sprain. There are 3 grades of sprains.   A grade 1 sprain is a mild sprain. There is a slight pull without obvious tearing. There is no loss of strength, and the muscle and ligament are the correct length.  A grade 2 sprain is a moderate sprain. There is tearing of fibers within the substance of the ligament where it connects two bones or two cartilages. The length of the ligament is increased, and there is usually decreased strength.  A grade 3 sprain is a complete rupture of the ligament and is uncommon. In addition to the grade of sprain, there are three types of ankle sprains.  Lateral ankle sprains: This is a sprain of one or more of the three ligaments on the outer side (lateral) of the ankle. These are the most common sprains. Medial ankle sprains: There is one large triangular ligament of the inner side (medial) of the ankle that is susceptible to  injury. Medial ankle sprains are less common. Syndesmosis, "high ankle," sprains: The syndesmosis is the ligament that connects the two bones of the lower leg. Syndesmosis sprains usually only occur with very severe ankle sprains. SYMPTOMS  Pain, tenderness, and swelling in the ankle, starting at the side of injury that may progress to the whole ankle and foot with time.  "Pop" or tearing sensation at the time of injury.  Bruising that may spread to the heel.  Impaired ability to walk soon after injury. CAUSES   Acute ankle sprains are caused by trauma placed on the ankle that temporarily forces or pries the anklebone (talus) out of its normal socket.  Stretching or tearing of the ligaments that normally hold the joint in place (usually due to a twisting injury). RISK INCREASES WITH:  Previous ankle sprain.  Sports in which the foot may land awkwardly (i.e., basketball, volleyball, or soccer) or walking or running on uneven or rough surfaces.  Shoes with inadequate support to prevent sideways motion when stress occurs.  Poor strength and flexibility.  Poor balance skills.  Contact sports. PREVENTION   Warm up and stretch properly before activity.  Maintain physical fitness:  Ankle and leg flexibility, muscle strength, and endurance.  Cardiovascular fitness.  Balance training activities.  Use proper technique and have a coach correct improper technique.  Taping, protective strapping, bracing, or high-top tennis shoes may help prevent injury. Initially, tape is best; however, it loses most of its   support function within 10 to 15 minutes.  Wear proper-fitted protective shoes (High-top shoes with taping or bracing is more effective than either alone).  Provide the ankle with support during sports and practice activities for 12 months following injury. PROGNOSIS   If treated properly, ankle sprains can be expected to recover completely; however, the length of recovery  depends on the degree of injury.  A grade 1 sprain usually heals enough in 5 to 7 days to allow modified activity and requires an average of 6 weeks to heal completely.  A grade 2 sprain requires 6 to 10 weeks to heal completely.  A grade 3 sprain requires 12 to 16 weeks to heal.  A syndesmosis sprain often takes more than 3 months to heal. RELATED COMPLICATIONS   Frequent recurrence of symptoms may result in a chronic problem. Appropriately addressing the problem the first time decreases the frequency of recurrence and optimizes healing time. Severity of the initial sprain does not predict the likelihood of later instability.  Injury to other structures (bone, cartilage, or tendon).  A chronically unstable or arthritic ankle joint is a possibility with repeated sprains. TREATMENT Treatment initially involves the use of ice, medication, and compression bandages to help reduce pain and inflammation. Ankle sprains are usually immobilized in a walking cast or boot to allow for healing. Crutches may be recommended to reduce pressure on the injury. After immobilization, strengthening and stretching exercises may be necessary to regain strength and a full range of motion. Surgery is rarely needed to treat ankle sprains. MEDICATION   Nonsteroidal anti-inflammatory medications, such as aspirin and ibuprofen (do not take for the first 3 days after injury or within 7 days before surgery), or other minor pain relievers, such as acetaminophen, are often recommended. Take these as directed by your caregiver. Contact your caregiver immediately if any bleeding, stomach upset, or signs of an allergic reaction occur from these medications.  Ointments applied to the skin may be helpful.  Pain relievers may be prescribed as necessary by your caregiver. Do not take prescription pain medication for longer than 4 to 7 days. Use only as directed and only as much as you need. HEAT AND COLD  Cold treatment (icing)  is used to relieve pain and reduce inflammation for acute and chronic cases. Cold should be applied for 10 to 15 minutes every 2 to 3 hours for inflammation and pain and immediately after any activity that aggravates your symptoms. Use ice packs or an ice massage.  Heat treatment may be used before performing stretching and strengthening activities prescribed by your caregiver. Use a heat pack or a warm soak. SEEK IMMEDIATE MEDICAL CARE IF:   Pain, swelling, or bruising worsens despite treatment.  You experience pain, numbness, discoloration, or coldness in the foot or toes.  New, unexplained symptoms develop (drugs used in treatment may produce side effects.) EXERCISES  PHASE I EXERCISES RANGE OF MOTION (ROM) AND STRETCHING EXERCISES - Ankle Sprain, Acute Phase I, Weeks 1 to 2 These exercises may help you when beginning to restore flexibility in your ankle. You will likely work on these exercises for the 1 to 2 weeks after your injury. Once your physician, physical therapist, or athletic trainer sees adequate progress, he or she will advance your exercises. While completing these exercises, remember:   Restoring tissue flexibility helps normal motion to return to the joints. This allows healthier, less painful movement and activity.  An effective stretch should be held for at least 30 seconds.    A stretch should never be painful. You should only feel a gentle lengthening or release in the stretched tissue. RANGE OF MOTION - Dorsi/Plantar Flexion  While sitting with your right / left knee straight, draw the top of your foot upwards by flexing your ankle. Then reverse the motion, pointing your toes downward.  Hold each position for __________ seconds.  After completing your first set of exercises, repeat this exercise with your knee bent. Repeat __________ times. Complete this exercise __________ times per day.  RANGE OF MOTION - Ankle Alphabet  Imagine your right / left big toe is a  pen.  Keeping your hip and knee still, write out the entire alphabet with your "pen." Make the letters as large as you can without increasing any discomfort. Repeat __________ times. Complete this exercise __________ times per day.  STRENGTHENING EXERCISES - Ankle Sprain, Acute -Phase I, Weeks 1 to 2 These exercises may help you when beginning to restore strength in your ankle. You will likely work on these exercises for 1 to 2 weeks after your injury. Once your physician, physical therapist, or athletic trainer sees adequate progress, he or she will advance your exercises. While completing these exercises, remember:   Muscles can gain both the endurance and the strength needed for everyday activities through controlled exercises.  Complete these exercises as instructed by your physician, physical therapist, or athletic trainer. Progress the resistance and repetitions only as guided.  You may experience muscle soreness or fatigue, but the pain or discomfort you are trying to eliminate should never worsen during these exercises. If this pain does worsen, stop and make certain you are following the directions exactly. If the pain is still present after adjustments, discontinue the exercise until you can discuss the trouble with your clinician. STRENGTH - Dorsiflexors  Secure a rubber exercise band/tubing to a fixed object (i.e., table, pole) and loop the other end around your right / left foot.  Sit on the floor facing the fixed object. The band/tubing should be slightly tense when your foot is relaxed.  Slowly draw your foot back toward you using your ankle and toes.  Hold this position for __________ seconds. Slowly release the tension in the band and return your foot to the starting position. Repeat __________ times. Complete this exercise __________ times per day.  STRENGTH - Plantar-flexors   Sit with your right / left leg extended. Holding onto both ends of a rubber exercise band/tubing,  loop it around the ball of your foot. Keep a slight tension in the band.  Slowly push your toes away from you, pointing them downward.  Hold this position for __________ seconds. Return slowly, controlling the tension in the band/tubing. Repeat __________ times. Complete this exercise __________ times per day.  STRENGTH - Ankle Eversion  Secure one end of a rubber exercise band/tubing to a fixed object (table, pole). Loop the other end around your foot just before your toes.  Place your fists between your knees. This will focus your strengthening at your ankle.  Drawing the band/tubing across your opposite foot, slowly, pull your little toe out and up. Make sure the band/tubing is positioned to resist the entire motion.  Hold this position for __________ seconds. Have your muscles resist the band/tubing as it slowly pulls your foot back to the starting position.  Repeat __________ times. Complete this exercise __________ times per day.  STRENGTH - Ankle Inversion  Secure one end of a rubber exercise band/tubing to a fixed object (table, pole).   Loop the other end around your foot just before your toes.  Place your fists between your knees. This will focus your strengthening at your ankle.  Slowly, pull your big toe up and in, making sure the band/tubing is positioned to resist the entire motion.  Hold this position for __________ seconds.  Have your muscles resist the band/tubing as it slowly pulls your foot back to the starting position. Repeat __________ times. Complete this exercises __________ times per day.  STRENGTH - Towel Curls  Sit in a chair positioned on a non-carpeted surface.  Place your right / left foot on a towel, keeping your heel on the floor.  Pull the towel toward your heel by only curling your toes. Keep your heel on the floor.  If instructed by your physician, physical therapist, or athletic trainer, add weight to the end of the towel. Repeat __________  times. Complete this exercise __________ times per day.   This information is not intended to replace advice given to you by your health care provider. Make sure you discuss any questions you have with your health care provider.   Document Released: 05/24/2005 Document Revised: 11/13/2014 Document Reviewed: 02/04/2009 Elsevier Interactive Patient Education 2016 [employer]vier Inc.  

## 2016-03-30 NOTE — Progress Notes (Signed)
Subjective:    Patient ID: Christina Davies, female    DOB: 03/24/1981, 35 y.o.   MRN: [id number]  03/30/2016  Follow-up   HPI This 35 y.o. female presents for evaluation of persistent L ankle sprain lateral.  Improving from last visit; 50% improved; still having some swelling laterally.  Bruising mildly.  Still wearing brace during the day.  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    Scheduled for PT test with service next weekend; does not feel that can run 1.5 miles in one week.    Had Mirena removed.  Last menses ten days and black.  Intermittent black for ten days. For seven or eight months, has a funny smell.  Just checked in April; same partner.  Did go in for odor in April.     Review of Systems  Constitutional: Negative for fever, chills, diaphoresis and fatigue.  HENT: Negative for ear pain, postnasal drip, rhinorrhea, sinus pressure, sore throat and trouble swallowing.   Respiratory: Negative for cough and shortness of breath.   Cardiovascular: Negative for chest pain, palpitations and leg swelling.  Gastrointestinal: Negative for nausea, vomiting, abdominal pain, diarrhea and constipation.  Genitourinary: Positive for menstrual problem. Negative for dysuria, urgency, hematuria, vaginal discharge, genital sores, vaginal pain and pelvic pain.  Musculoskeletal: Positive for joint swelling, arthralgias and gait problem.    Past Medical History  Diagnosis Date  . Bronchitis   . Heart murmur     Grew out of it as a child  . Arthritis     Left knee   Past Surgical History  Procedure Laterality Date  . Knee surgery Left   . Arthroscopic repair acl Left 2008  . Knee arthroscopy with lateral menisectomy Left     2008  . Cesarean section      2011, 2013   Allergies  Allergen Reactions  . Latex     Social History   Social History  . Marital Status: Legally Separated    Spouse Name: N/A  . Number of Children: N/A  . Years of Education: Masters   Occupational History  .  Teacher     [employer]  . [employer]   Social History Main Topics  . Smoking status: Never Smoker   . Smokeless tobacco: Never Used  . Alcohol Use: No     Comment: Occasionally  . Drug Use: No  . Sexual Activity: Yes    Birth Control/ Protection: Pill   Other Topics Concern  . Not on file   Social History Narrative   Lives with two children. 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    Family History  Problem Relation Age of Onset  . Hypertension Mother   . Hyperlipidemia Mother   . Hypertension Father   . Diabetes Father   . Hypertension Maternal Grandmother   . Hypertension Maternal Grandfather   . Hypertension Paternal Grandmother   . Cancer Paternal Grandmother   . Heart disease Paternal Grandmother   . Hypertension Paternal Grandfather        Objective:    BP 124/80 mmHg  Pulse 68  Temp(Src) 98 F (36.7 C) (Oral)  Resp 16  Ht 5' 4.5" (1.638 m)  Wt 163 lb (73.936 kg)  BMI 27.56 kg/m2  SpO2 97%  LMP 03/11/2016 Physical Exam  Constitutional: She is oriented to person, place, and time. She appears  well-developed and well-nourished. No distress.  HENT:  Head: Normocephalic and atraumatic.  Eyes: Conjunctivae are normal. Pupils are equal, round, and reactive to light.  Neck: Normal range of motion. Neck supple.  Cardiovascular: Normal rate, regular rhythm and normal heart sounds.  Exam reveals no gallop and no friction rub.   No murmur heard. Pulmonary/Chest: Effort normal and breath sounds normal. She has no wheezes. She has no rales.  Genitourinary: Vagina normal and uterus normal. There is no rash, tenderness or lesion on the right labia. There is no rash, tenderness or lesion on the left labia. Cervix exhibits no motion tenderness, no discharge and no friability. Right adnexum displays no mass, no tenderness and no fullness. Left adnexum displays no mass, no  tenderness and no fullness. No vaginal discharge found.  Musculoskeletal:       Left ankle: She exhibits normal range of motion, no swelling and no ecchymosis. No tenderness. No lateral malleolus and no medial malleolus tenderness found. Achilles tendon normal.  Neurological: She is alert and oriented to person, place, and time.  Skin: She is not diaphoretic.  Psychiatric: She has a normal mood and affect. Her behavior is normal.  Nursing note and vitals reviewed.  Results for orders placed or performed in visit on 03/30/16  POCT Wet + KOH Prep  Result Value Ref Range   Yeast by KOH Absent Present, Absent   Yeast by wet prep Absent Present, Absent   WBC by wet prep Few None, Few, Too numerous to count   Clue Cells Wet Prep HPF POC None None, Too numerous to count   Trich by wet prep Absent Present, Absent   Bacteria Wet Prep HPF POC Few None, Few, Too numerous to count   Epithelial Cells By Newell RubbermaidWet Pref ([facility]) Many (A) None, Few, Too numerous to count   RBC,UR,HPF,POC None None RBC/hpf  POCT urinalysis dipstick  Result Value Ref Range   Color, UA yellow yellow   Clarity, UA clear clear   Glucose, UA negative negative   Bilirubin, UA negative negative   Ketones, POC UA negative negative   Spec Grav, UA 1.010    Blood, UA negative negative   pH, UA 7.0    Protein Ur, POC negative negative   Urobilinogen, UA 0.2    Nitrite, UA Negative Negative   Leukocytes, UA Negative Negative  POCT Microscopic Urinalysis ([facility])  Result Value Ref Range   WBC,UR,HPF,POC None None WBC/hpf   RBC,UR,HPF,POC None None RBC/hpf   Bacteria None None, Too numerous to count   Mucus Absent Absent   Epithelial Cells, UR Per Microscopy Few (A) None, Too numerous to count cells/hpf       Assessment & Plan:   1. Left ankle strain, subsequent encounter   2. Vaginal odor   3. BV (bacterial vaginosis)     Orders Placed This Encounter  Procedures  . POCT Wet + KOH Prep  . POCT urinalysis dipstick  .  POCT Microscopic Urinalysis ([facility])   Meds ordered this encounter  Medications  . metroNIDAZOLE (FLAGYL) 500 MG tablet    Sig: Take 1 tablet (500 mg total) by mouth 2 (two) times daily.    Dispense:  14 tablet    Refill:  0    No Follow-up on file.    Belynda Pagaduan Paulita FujitaMartin Malaiah Viramontes, M.D. [facility]  [place] [address] [place], [place]  [id number] [phone] phone [phone] fax

## 2017-08-29 ENCOUNTER — Ambulatory Visit: Payer: BC Managed Care – PPO | Admitting: Family Medicine

## 2018-01-13 IMAGING — CR DG ANKLE COMPLETE 3+V*L*
4 series · 4 of 4 positions shown · non-contrast
Comparison: None.

CLINICAL DATA: Left ankle injury, inversion injury

EXAM:
LEFT ANKLE COMPLETE - 3+ VIEW

[AP]
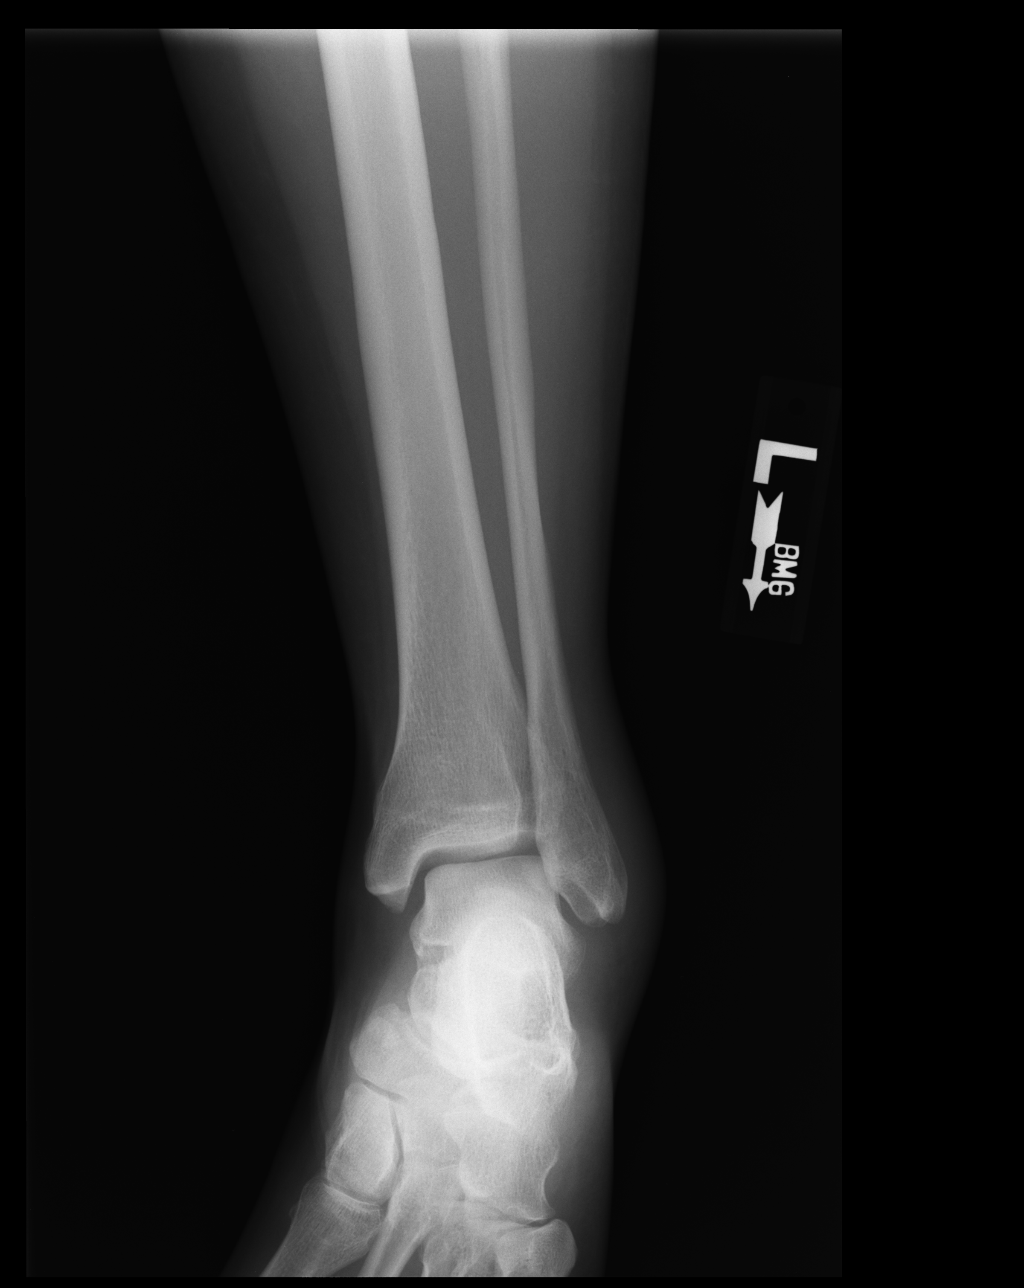

[ap obl int rot]
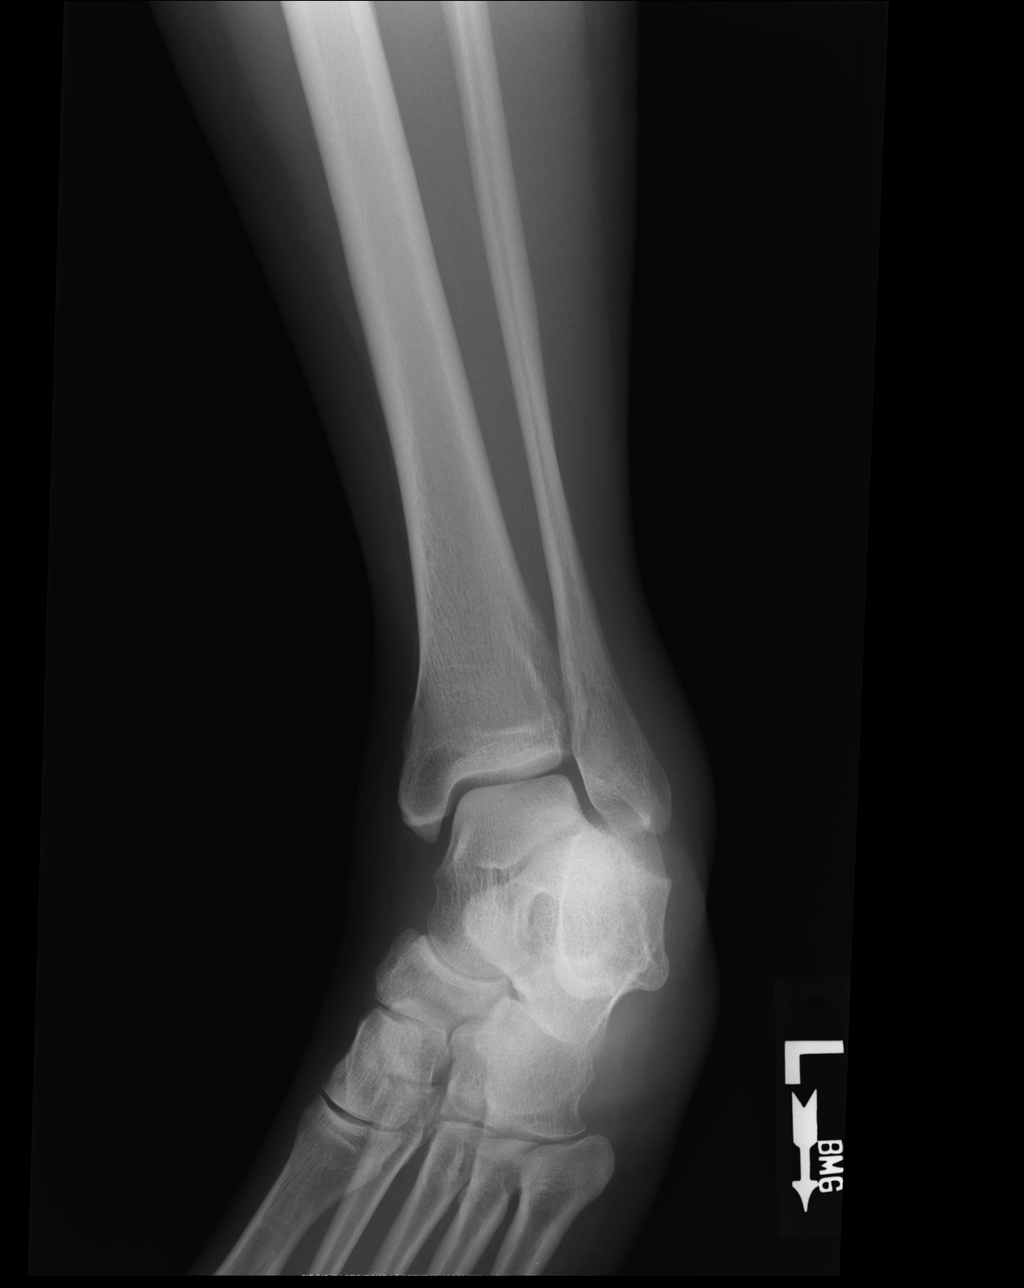

[medial obl]
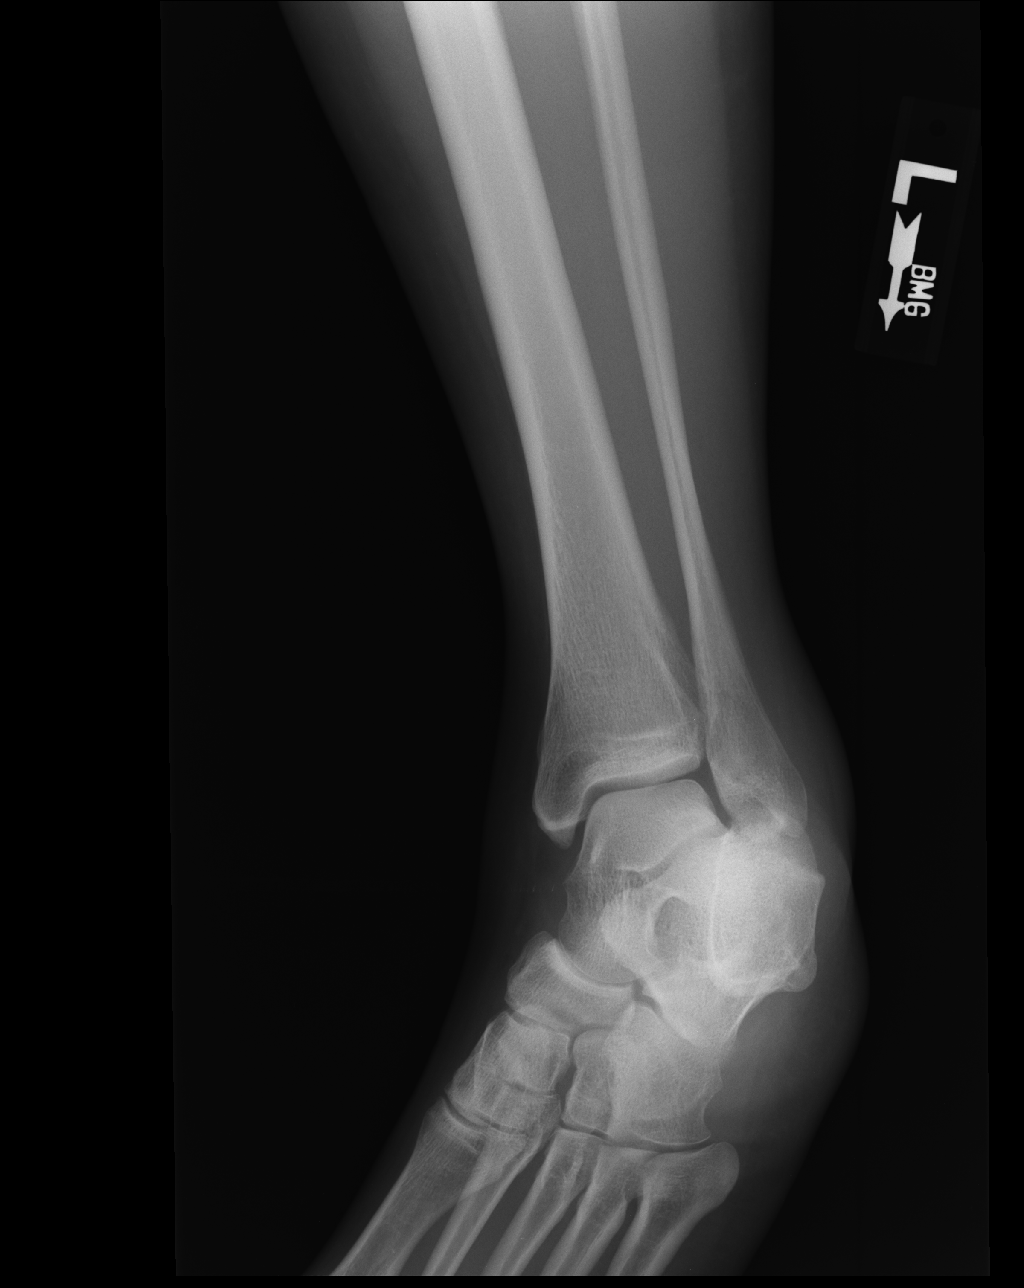

[lateral]
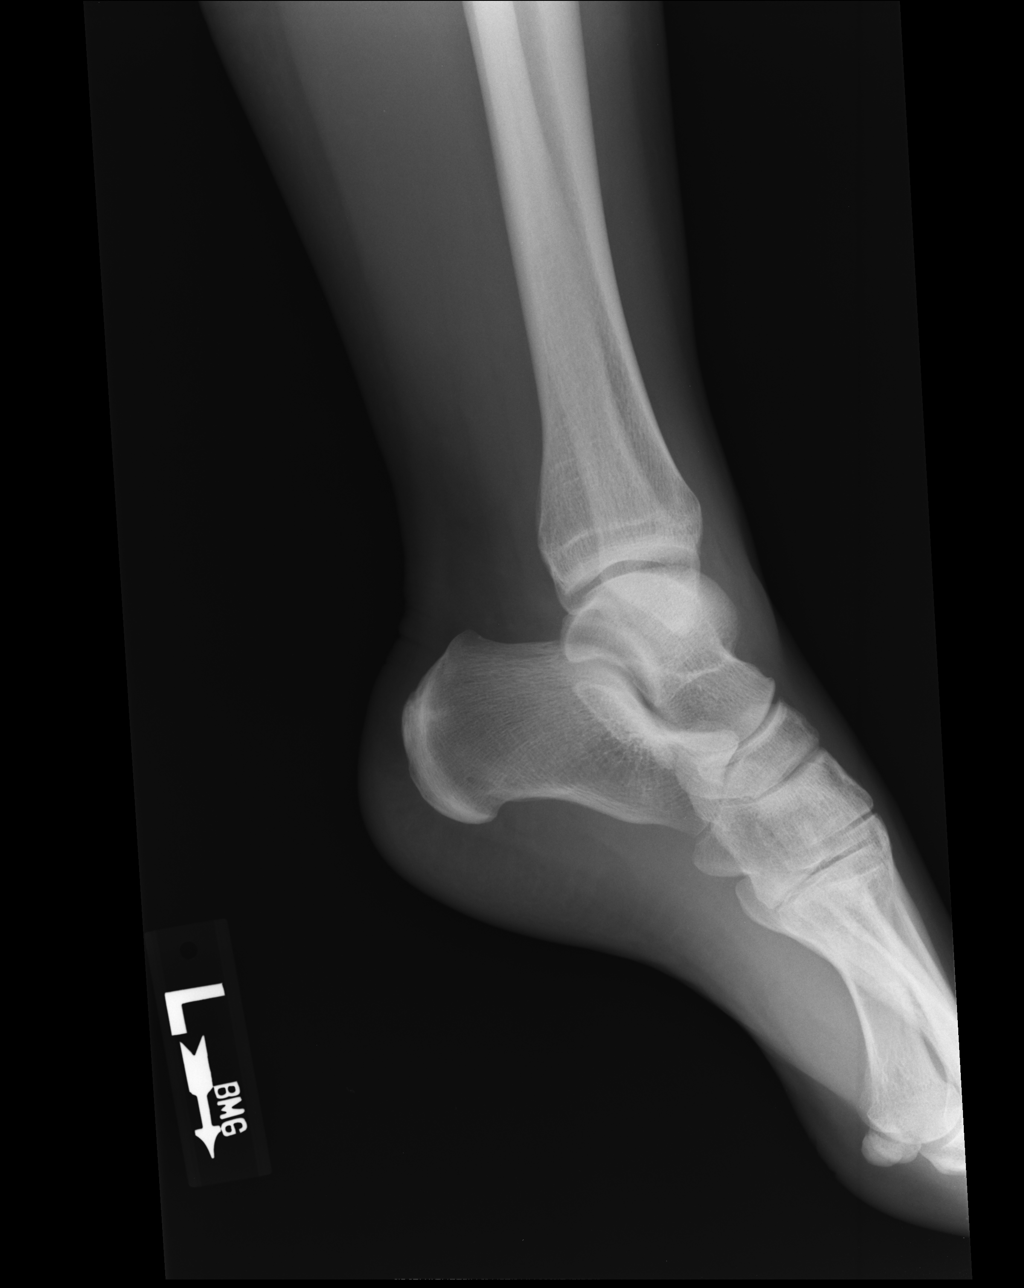

[4 of 4 positions shown; findings below may reference images not displayed]

FINDINGS: There is no evidence of fracture, dislocation, or joint effusion.
There is no evidence of arthropathy or other focal bone abnormality.
There is soft tissue swelling overlying the lateral malleolus.
IMPRESSION: No acute osseous injury of the left ankle.

## 2018-03-22 ENCOUNTER — Encounter: Payer: Self-pay | Admitting: Family Medicine

## 2018-03-27 ENCOUNTER — Encounter: Payer: Self-pay | Admitting: Family Medicine

## 2019-12-09 ENCOUNTER — Other Ambulatory Visit: Payer: Self-pay | Admitting: Obstetrics and Gynecology

## 2019-12-09 DIAGNOSIS — R102 Pelvic and perineal pain: Secondary | ICD-10-CM

## 2019-12-16 ENCOUNTER — Ambulatory Visit
Admission: RE | Admit: 2019-12-16 | Discharge: 2019-12-16 | Disposition: A | Discharge status: Home / Self Care | Payer: BC Managed Care – PPO | Source: Ambulatory Visit | Attending: Obstetrics and Gynecology | Admitting: Obstetrics and Gynecology

## 2019-12-16 DIAGNOSIS — R102 Pelvic and perineal pain: Secondary | ICD-10-CM

## 2020-07-13 ENCOUNTER — Other Ambulatory Visit: Payer: BC Managed Care – PPO

## 2021-10-09 IMAGING — US US PELVIS COMPLETE WITH TRANSVAGINAL
1 series · 14 of 25 positions shown · non-contrast
Comparison: None

CLINICAL DATA: Pelvic pain for 1 week, IUD placement

EXAM:
TRANSABDOMINAL AND TRANSVAGINAL ULTRASOUND OF PELVIS
TECHNIQUE: Both transabdominal and transvaginal ultrasound examinations of the
pelvis were performed. Transabdominal technique was performed for
global imaging of the pelvis including uterus, ovaries, adnexal
regions, and pelvic cul-de-sac. It was necessary to proceed with
endovaginal exam following the transabdominal exam to visualize the
endometrium, IUD, and LEFT ovary.

[Series 1: us pelvis complete with transvaginal · 0.21mm/px · 14 of 61 slices shown]
[im 1/61]
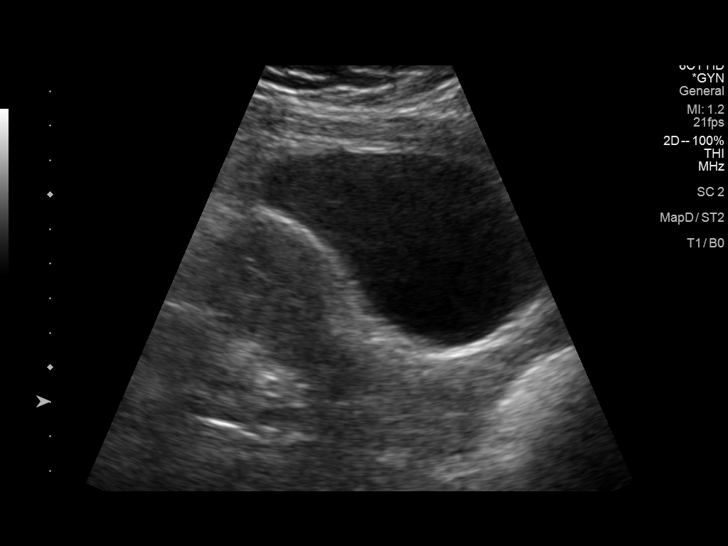
[im 6/61]
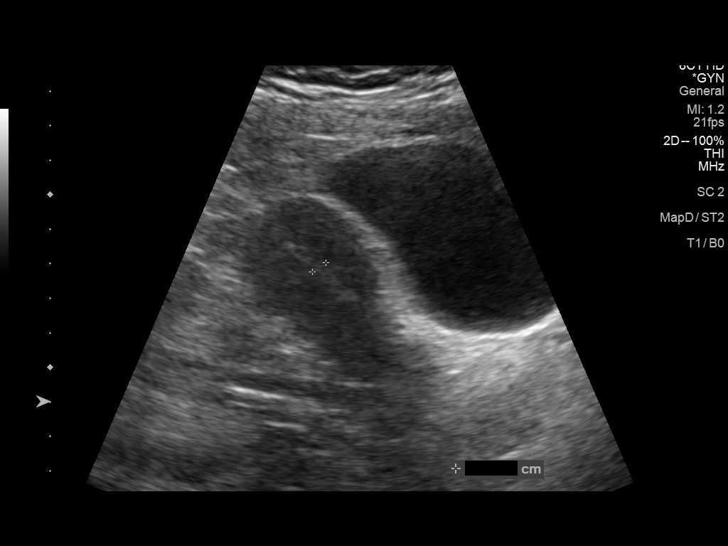
[im 11/61]
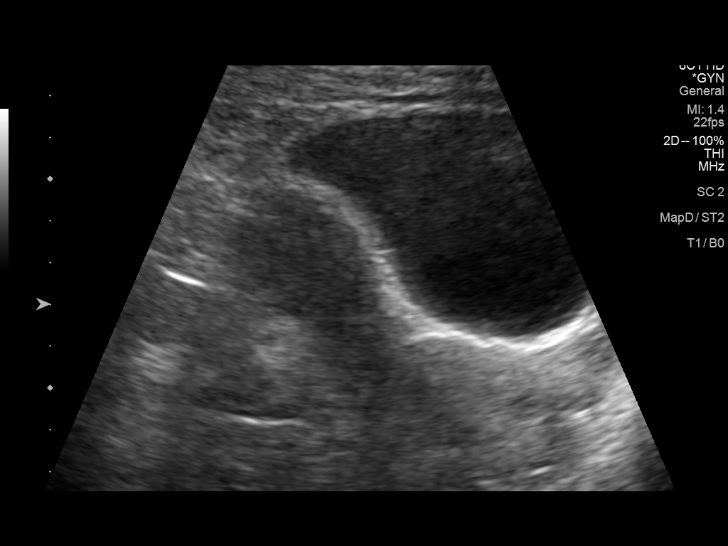
[im 16/61]
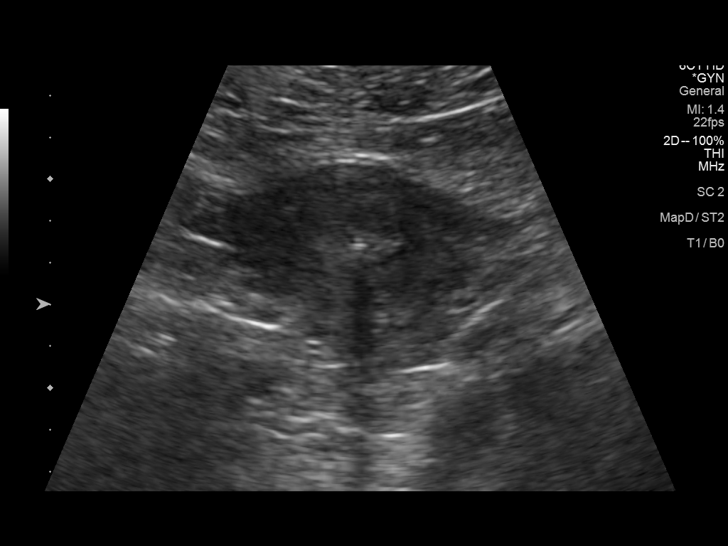
[im 21/61]
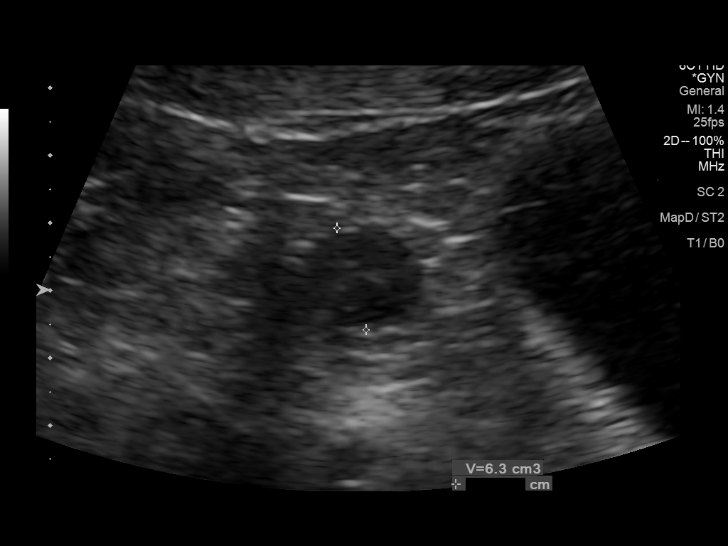
[im 23/61]
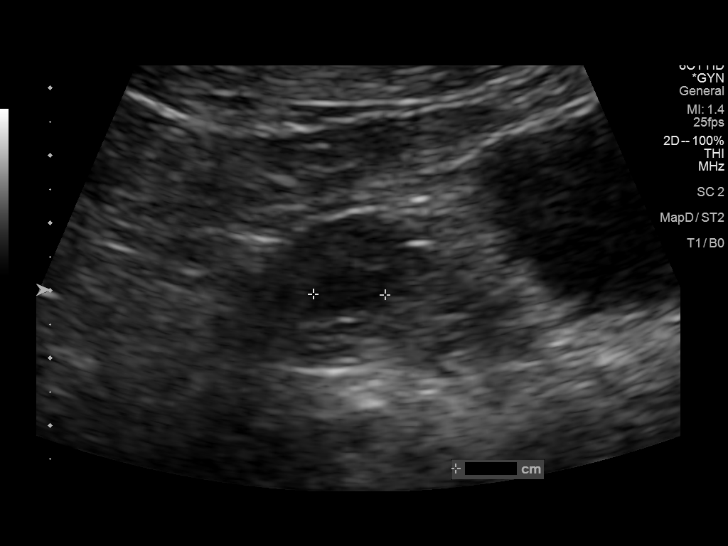
[im 28/61]
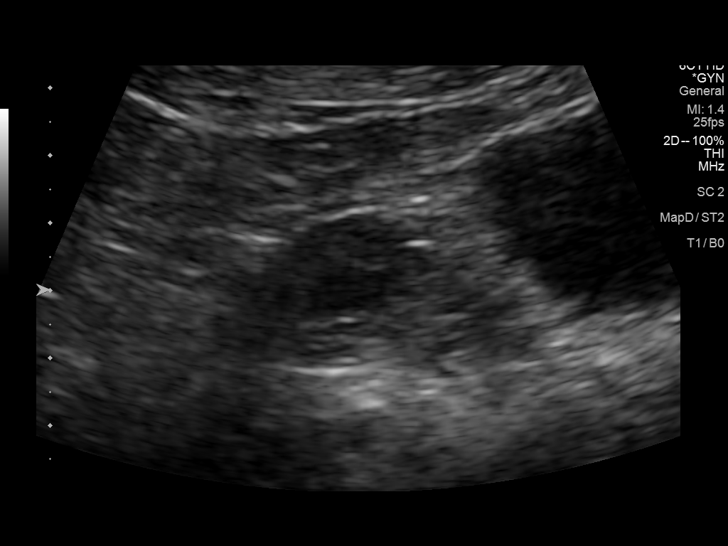
[im 33/61]
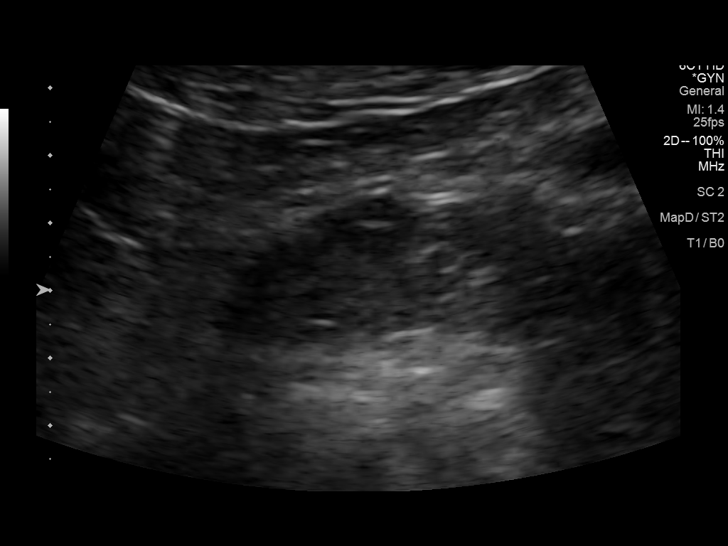
[im 38/61]
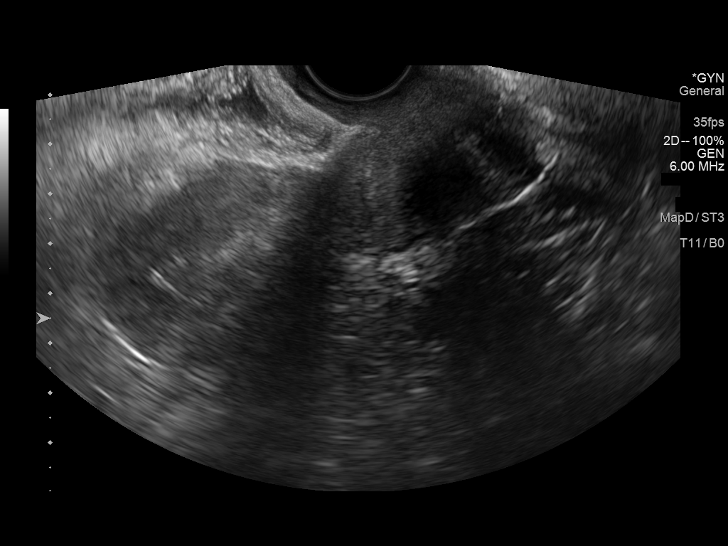
[im 41/61]
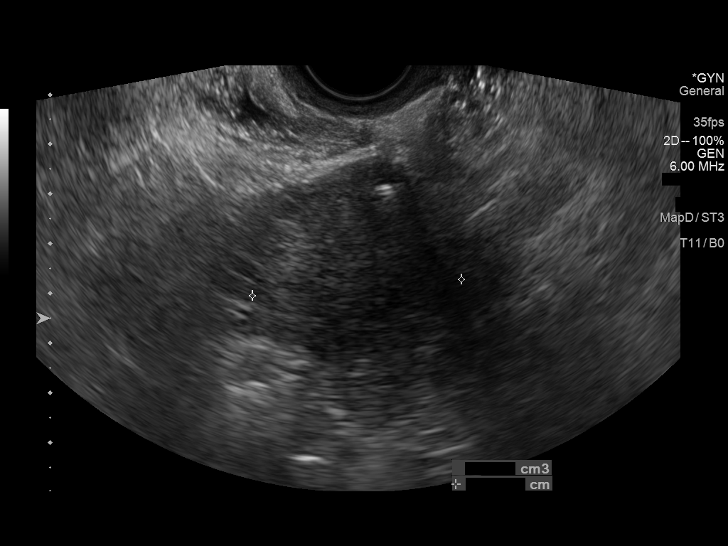
[im 46/61]
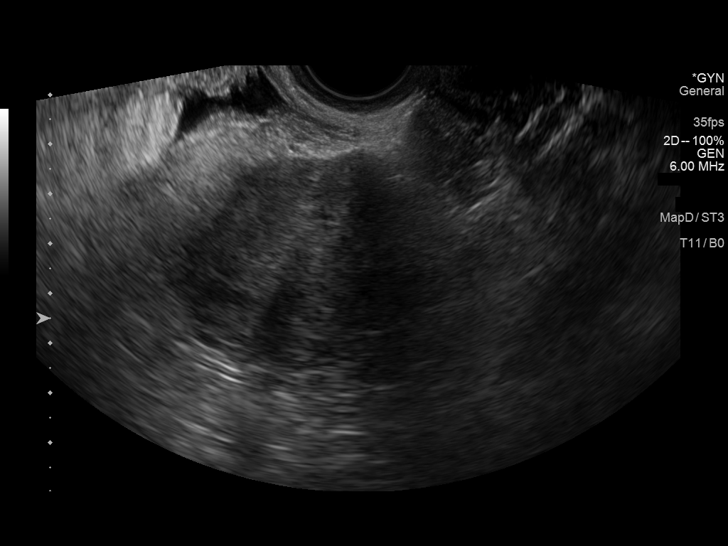
[im 51/61]
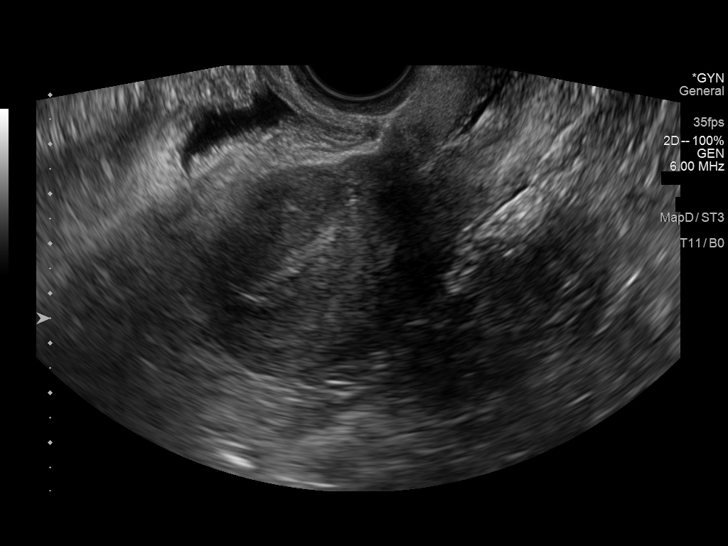
[im 56/61]
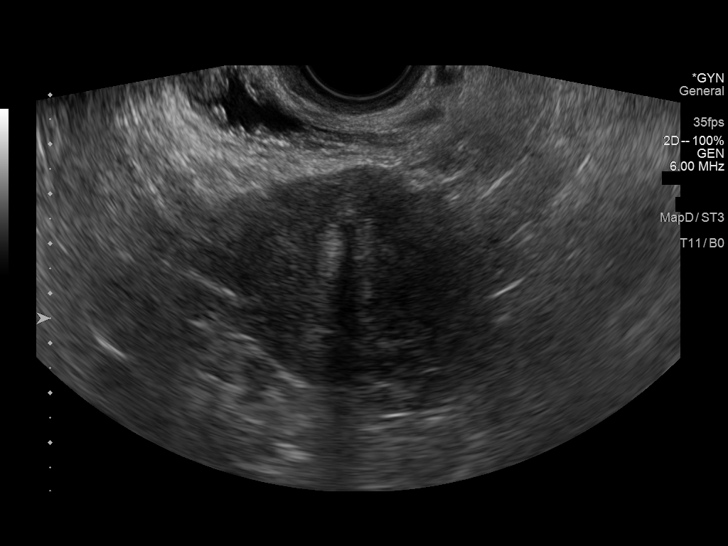
[im 61/61]
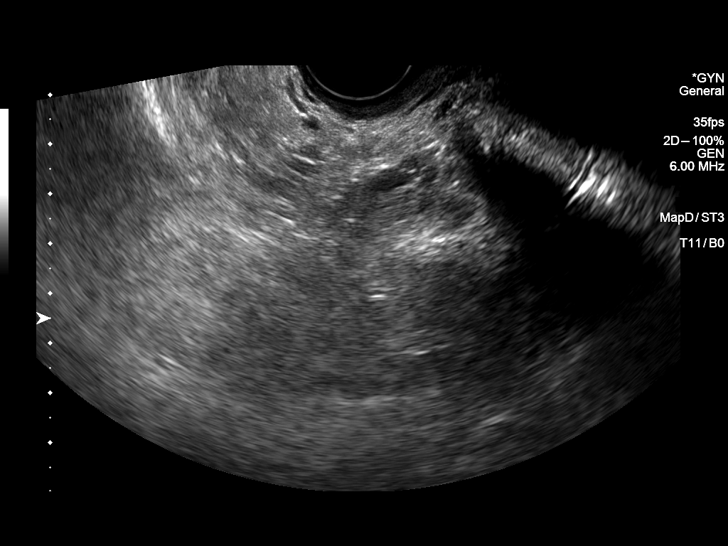

[14 of 25 positions shown; findings below may reference images not displayed]

FINDINGS: Uterus

Measurements: 9.6 x 3.5 x 5.3 cm = volume: 93 mL. Anteverted. Normal
morphology without mass

Endometrium

Thickness: 3 mm. IUD located at mid to lower uterine segments,
appears to extend into the myometrial wall. No endometrial fluid.

Right ovary

Measurements: 3.4 x 2.2 x 1.6 cm = volume: 6.3 mL. Normal morphology
without mass

Left ovary

Not visualized, likely obscured by bowel

Other findings

No free pelvic fluid.  No adnexal masses.
IMPRESSION: Nonvisualization of LEFT ovary.

IUD is located in the mid to lower uterine segments and appears to
extend into the myometrial wall.

Remainder of exam unremarkable.

## 2023-06-22 ENCOUNTER — Other Ambulatory Visit: Payer: Self-pay

## 2023-06-22 ENCOUNTER — Encounter (HOSPITAL_COMMUNITY): Payer: Self-pay | Admitting: *Deleted

## 2023-06-22 ENCOUNTER — Emergency Department (HOSPITAL_COMMUNITY)
Admission: EM | Admit: 2023-06-22 | Discharge: 2023-06-23 | Disposition: A | Discharge status: Home / Self Care | Payer: BC Managed Care – PPO | Attending: Emergency Medicine | Admitting: Emergency Medicine

## 2023-06-22 DIAGNOSIS — Z91040 Latex allergy status: Secondary | ICD-10-CM | POA: Diagnosis not present

## 2023-06-22 DIAGNOSIS — R059 Cough, unspecified: Secondary | ICD-10-CM | POA: Diagnosis not present

## 2023-06-22 DIAGNOSIS — R519 Headache, unspecified: Secondary | ICD-10-CM | POA: Insufficient documentation

## 2023-06-22 DIAGNOSIS — R110 Nausea: Secondary | ICD-10-CM | POA: Diagnosis not present

## 2023-06-22 DIAGNOSIS — Z77120 Contact with and (suspected) exposure to mold (toxic): Secondary | ICD-10-CM | POA: Diagnosis not present

## 2023-06-22 NOTE — ED Triage Notes (Signed)
The pt reports that she has mold in her apartment and she has the pictures of the mold  she has had a headache and a cough for months  she moved into this apartment in february

## 2023-06-23 ENCOUNTER — Emergency Department (HOSPITAL_COMMUNITY): Payer: BC Managed Care – PPO

## 2023-06-23 MED ORDER — ONDANSETRON HCL 4 MG PO TABS: 4.0000 mg | ORAL_TABLET | Freq: Four times a day (QID) | ORAL | 0 refills | Status: AC

## 2023-06-23 MED ORDER — ONDANSETRON 4 MG PO TBDP
4.0000 mg | ORAL_TABLET | Freq: Once | ORAL | Status: AC
  Administered 2023-06-23: 4 mg via ORAL
  Filled 2023-06-23: qty 1

## 2023-06-23 NOTE — ED Provider Notes (Signed)
[place] EMERGENCY DEPARTMENT AT [facility] Provider Note  CSN: [id number] Arrival date & time: 06/22/23 2136  Chief Complaint(s) Headache  HPI Christina Davies is a 42 y.o. female with a past medical history listed below including reported rheumatoid arthritis on hydroxychloroquine here for headache and cough for several months.  She reports that she has been dealing with mold in her apartment.  Recent testing confirmed mold.  Patient also endorses nausea.  No vomiting. No fevers.  HPI  Past Medical History Past Medical History:  Diagnosis Date   Arthritis    Left knee   Bronchitis    Heart murmur    Grew out of it as a child   Patient Active Problem List   Diagnosis Date Noted   Left ankle injury 03/21/2016   Home Medication(s) Prior to Admission medications   Medication Sig Start Date End Date Taking? Authorizing Provider  ondansetron (ZOFRAN) 4 MG tablet Take 1 tablet (4 mg total) by mouth every 6 (six) hours. 06/23/23  Yes Lania Zawistowski, Amadeo Garnet, MD  Butalbital-APAP-Caffeine 50-300-40 MG CAPS Take by mouth.    [provider]  chlorpheniramine-HYDROcodone (TUSSIONEX PENNKINETIC ER) 10-8 MG/5ML SUER Take 5 mLs by mouth at bedtime as needed for cough. Patient not taking: Reported on 03/21/2016 01/05/16   Porfirio Oar, PA  metroNIDAZOLE (FLAGYL) 500 MG tablet Take 1 tablet (500 mg total) by mouth 2 (two) times daily. 03/30/16   Ethelda Chick, MD  omeprazole (PRILOSEC) 20 MG capsule Take 20 mg by mouth daily. Reported on 03/30/2016    [provider]  pantoprazole (PROTONIX) 40 MG tablet Reported on 03/30/2016 11/04/15   [provider]  [facility] 0.18/0.215/0.25 MG-35 MCG tablet  12/14/15   [provider]                                                                                                                                    Allergies Latex  Review of Systems Review of Systems As noted in HPI  Physical  Exam Vital Signs  I have reviewed the triage vital signs BP (!) 150/85   Pulse 75   Temp 98.4 F (36.9 C) (Oral)   Resp 16   Ht 5' (1.524 m)   Wt 73.9 kg   SpO2 100%   BMI 31.82 kg/m   Physical Exam Vitals reviewed.  Constitutional:      General: She is not in acute distress.    Appearance: She is well-developed. She is not diaphoretic.  HENT:     Head: Normocephalic and atraumatic.     Nose: Nose normal.  Eyes:     General: No scleral icterus.       Right eye: No discharge.        Left eye: No discharge.     Conjunctiva/sclera: Conjunctivae normal.     Pupils: Pupils are equal, round, and reactive to light.  Cardiovascular:  Rate and Rhythm: Normal rate and regular rhythm.     Heart sounds: No murmur heard.    No friction rub. No gallop.  Pulmonary:     Effort: Pulmonary effort is normal. No respiratory distress.     Breath sounds: Normal breath sounds. No stridor. No rales.  Abdominal:     General: There is no distension.     Palpations: Abdomen is soft.     Tenderness: There is no abdominal tenderness.  Musculoskeletal:        General: No tenderness.     Cervical back: Normal range of motion and neck supple.  Skin:    General: Skin is warm and dry.     Findings: No erythema or rash.  Neurological:     Mental Status: She is alert and oriented to person, place, and time.     ED Results and Treatments Labs (all labs ordered are listed, but only abnormal results are displayed) Labs Reviewed - No data to display                                                                                                                       EKG  EKG Interpretation Date/Time:    Ventricular Rate:    PR Interval:    QRS Duration:    QT Interval:    QTC Calculation:   R Axis:      Text Interpretation:         Radiology DG Chest 2 View  Result Date: 06/23/2023 CLINICAL DATA:  Cough. EXAM: CHEST - 2 VIEW COMPARISON:  01/05/2016. FINDINGS: The heart size and  mediastinal contours are within normal limits. Both lungs are clear. No acute osseous abnormality. IMPRESSION: No active cardiopulmonary disease. Electronically Signed   By: Thornell Sartorius M.D.   On: 06/23/2023 04:27    Medications Ordered in ED Medications  ondansetron (ZOFRAN-ODT) disintegrating tablet 4 mg (4 mg Oral Given 06/23/23 0500)   Procedures Procedures  (including critical care time) Medical Decision Making / ED Course   Medical Decision Making Amount and/or Complexity of Data Reviewed Radiology: ordered and independent interpretation performed. Decision-making details documented in ED Course.  Risk Prescription drug management.    Mold exposure. Mild headache. Non focal neuro exam. No recent head trauma. No fever. Doubt meningitis. Doubt intracranial bleed. Doubt IIH. No indication for imaging.   Couch. CXR negative.  Given ODT zofran for nausea.     Final Clinical Impression(s) / ED Diagnoses Final diagnoses:  Mold exposure  Nonintractable headache, unspecified chronicity pattern, unspecified headache type  Cough, unspecified type   The patient appears reasonably screened and/or stabilized for discharge and I doubt any other medical condition or other [facility] requiring further screening, evaluation, or treatment in the ED at this time. I have discussed the findings, Dx and Tx plan with the patient/family who expressed understanding and agree(s) with the plan. Discharge instructions discussed at length. The patient/family was given strict return precautions who verbalized understanding of the instructions.  No further questions at time of discharge.  Disposition: Discharge  Condition: Good  ED Discharge Orders          Ordered    ondansetron (ZOFRAN) 4 MG tablet  Every 6 hours        06/23/23 4098             Follow Up: Lorenda Ishihara, MD [address] [place] [address] [phone]  Call  to schedule an appointment for  close follow up    This chart was dictated using voice recognition software.  Despite best efforts to proofread,  errors can occur which can change the documentation meaning.    Nira Conn, MD 06/23/23 [phone]

## 2023-06-23 NOTE — Discharge Instructions (Signed)
Overview There is always some mold around. Molds have been on the Earth for millions of years. Mold can get in your home through open doors, windows, vents, and heating and air conditioning systems. Mold in the air outside can be brought indoors on clothing, shoes, bags, and even pets.  Mold will grow where there is moisture, such as around leaks in roofs, windows, or pipes, or where there has been a flood. Mold grows on paper, cardboard, ceiling tiles, and wood. Mold can also grow in dust, paints, wallpaper, insulation, drywall, carpet, fabric, and upholstery.  Common types Black mold on inside walls. The most common indoor molds are Cladosporium, Penicillium, and Aspergillus. Mold is found both indoors and outdoors. Mold can enter your home through open doorways, windows, vents, and heating and air conditioning systems. Mold in the air outside can also attach itself to clothing, shoes, and pets can and be carried indoors.  When mold spores drop on places with excessive moisture, such as where leakage may have occurred in roofs, pipes, walls, plant pots, or where there has been flooding, they will grow.  Preventing Mold can cause many health effects. For some people, mold can cause a stuffy nose, sore throat, coughing or wheezing, burning eyes, or skin rash. People with asthma or who are allergic to mold may have severe reactions. Immune-compromised people and people with chronic lung disease may get infections in their lungs from mold.  There are steps you can take to prevent mold growth in your home.  Keep humidity levels in your home as low as you can -- no higher than 50% -- all day long. An air conditioner or dehumidifier will help you keep the level low. Be sure the air in your home flows freely. Use exhaust fans in the kitchen and bathroom that vent to outside your home. Make sure your clothes dryer vents outside your home. Fix any leaks in your home's roof, walls, or plumbing so mold  does not have moisture to grow. Clean up and dry out your home fully and quickly (within 24-48 hours) after a flood. Add mold inhibitors to paints before painting. You can buy mold inhibitors at paint and home improvement stores. Clean bathrooms with mold-killing products. Remove or replace carpets and upholstery that have been soaked and cannot be dried right away. Don't use carpet in places like bathrooms or basements that may have a lot of moisture.  Cleaning If you see or smell mold, you should remove it. You do not need to know the type of mold. If mold is growing in your home, you need to clean up the mold and fix the moisture problem.  Mold can be removed from hard surfaces with household products, soap and water, or a bleach solution of no more than 1 cup of household laundry bleach in 1 gallon of water.  If you use bleach Never mix bleach with ammonia or other cleaners. This will produce a poisonous gas. Follow manufacturers' instructions when you use bleach or any other cleaning product. Open windows and doors to provide fresh air. Wear rubber boots, rubber gloves, and goggles during cleanup. For more information, read the guide on safely cleaning up mold (Also available in [place]  [employer] of Homeowners and Renters Guide Homeowners and Renters Guide to [employer] from [employer], [employer], FEMA, HUD, and NIH on safe mold clean-up after a natural disaster MAR. 27, 2024 Home testing CDC does not recommend mold testing.  The health  effects of mold are different for different people so you cannot rely on sampling and culturing to know whether someone might become sick. No matter what type of mold is present, you need to remove it.  Also, good sampling for mold can be expensive, and there are no set standards for what is and what is not an acceptable quantity of different kinds of mold in a home. The best thing you can do is to safely remove the  mold and prevent future mold growth.

## 2023-08-14 ENCOUNTER — Other Ambulatory Visit: Payer: Self-pay

## 2023-08-14 ENCOUNTER — Ambulatory Visit: Payer: Self-pay | Admitting: Internal Medicine

## 2023-08-14 ENCOUNTER — Encounter: Payer: Self-pay | Admitting: Internal Medicine

## 2023-08-14 ENCOUNTER — Ambulatory Visit: Payer: BC Managed Care – PPO | Admitting: Internal Medicine

## 2023-08-14 VITALS — BP 136/82 | HR 77 | Temp 97.9°F | Resp 20 | Ht 64.75 in | Wt 180.6 lb

## 2023-08-14 DIAGNOSIS — L2082 Flexural eczema: Secondary | ICD-10-CM

## 2023-08-14 DIAGNOSIS — R053 Chronic cough: Secondary | ICD-10-CM

## 2023-08-14 DIAGNOSIS — J310 Chronic rhinitis: Secondary | ICD-10-CM | POA: Diagnosis not present

## 2023-08-14 DIAGNOSIS — G43019 Migraine without aura, intractable, without status migrainosus: Secondary | ICD-10-CM

## 2023-08-14 DIAGNOSIS — Z77120 Contact with and (suspected) exposure to mold (toxic): Secondary | ICD-10-CM

## 2023-08-14 DIAGNOSIS — L235 Allergic contact dermatitis due to other chemical products: Secondary | ICD-10-CM | POA: Diagnosis not present

## 2023-08-14 DIAGNOSIS — K219 Gastro-esophageal reflux disease without esophagitis: Secondary | ICD-10-CM | POA: Diagnosis not present

## 2023-08-14 MED ORDER — TRIAMCINOLONE ACETONIDE 0.1 % EX OINT: TOPICAL_OINTMENT | CUTANEOUS | 1 refills | Status: AC

## 2023-08-14 NOTE — Progress Notes (Signed)
NEW PATIENT Date of Service/Encounter:  08/14/23 Referring provider: Lorenda Davies,* Primary care provider: Lorenda Ishihara, MD  Subjective:  Christina Davies is a 42 y.o. female with a PMHx of migraines presenting today for evaluation of mold exposure, migraines and coughing. History obtained from: chart review and patient.   Discussed the use of AI scribe software for clinical note transcription with the patient, who gave verbal consent to proceed.  History of Present Illness   The patient presents with concerns about potential mold exposure in her current apartment, which she believes may be causing or exacerbating her symptoms. She reports waking up with a cough every morning for several months, daily headaches, and constant fatigue. These symptoms have been more frequent since moving into the apartment. The patient has a history of migraines and a previous concussion in 2019, but notes that the frequency of headaches has increased since living in the apartment.  She also reports a history of reflux which she has been trying to control with Tums and her diet.  She was previously on a medication for reflux daily, but her PCP advised that she stopped using it daily due to history of kidney disease.  She reports that her reflux seems overall well-controlled with occasional use of Tums or Pepcid over-the-counter.  She also reports a history of eczema, with a current dry patch on her right elbow.  She typically follows with a dermatologist, but forgot to mention the spot to him at her last visit.  She is not currently have any topical steroids prescribed.  The patient's children have also been experiencing health issues, with her son suffering from migraines to the point of vomiting. Their daughter has a history of asthma. The patient is concerned that these health issues may be related to the mold in her apartment. She is currently in the process of moving to a new  apartment.  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 She denies any known food or medication allergies and does not regularly take allergy medications. She has not previously experienced seasonal allergies or asthma.  The patient is seeking allergy testing to determine if her symptoms are related to mold exposure. She has had allergy testing in the past, which did not reveal any significant findings. She is also interested in exploring other potential causes of her symptoms.      Mold Report: + for ascospores, cytospores, cercospora, cladosporium, curvularia,  myxomycetes  Chart Review:  ED visit 06/22/23; history of rheumatoid arthritis on hydroxychloroquine seen for headache and cough and reported mold in her apartment.  Chest x-ray obtained and normal.  Nonfocal neuroexam.  Given ODT Zofran for nausea.  Past Medical History: Past Medical History:  Diagnosis Date   Arthritis    Left knee   Bronchitis    Heart murmur    Grew out of it as a child   Medication List:  Current Outpatient Medications  Medication Sig Dispense Refill   Butalbital-APAP-Caffeine 50-300-40 MG CAPS Take by mouth.     metroNIDAZOLE (FLAGYL) 500 MG tablet Take 1 tablet (500 mg total) by mouth 2 (two) times daily. 14 tablet 0   omeprazole (PRILOSEC) 20 MG capsule Take 20 mg by mouth daily. Reported on 03/30/2016     ondansetron (ZOFRAN) 4 MG tablet Take 1 tablet (4 mg total) by mouth every 6 (six) hours. 12 tablet 0  pantoprazole (PROTONIX) 40 MG tablet Reported on 03/30/2016  1   TRI-LINYAH 0.18/0.215/0.25 MG-35 MCG tablet   1   chlorpheniramine-HYDROcodone (TUSSIONEX PENNKINETIC ER) 10-8 MG/5ML SUER Take 5 mLs by mouth at bedtime as needed for cough. (Patient not taking: Reported on 03/21/2016) 50 mL 0   No current facility-administered medications for this visit.   Known Allergies:  Allergies   Allergen Reactions   Latex    Past Surgical History: Past Surgical History:  Procedure Laterality Date   ARTHROSCOPIC REPAIR ACL Left 2008   CESAREAN SECTION     2011, 2013   KNEE ARTHROSCOPY WITH LATERAL MENISECTOMY Left    2008   KNEE SURGERY Left    Family History: Family History  Problem Relation Age of Onset   Hypertension Mother    Hyperlipidemia Mother    Hypertension Father    Diabetes Father    Hypertension Maternal Grandmother    Hypertension Maternal Grandfather    Hypertension Paternal Grandmother    Cancer Paternal Grandmother    Heart disease Paternal Grandmother    Hypertension Paternal Grandfather    Asthma Daughter    Social History: Christina Davies lives in an apartment built in the 1960s with mold in the walls, linoleum floors, electric heating, central AC, no pets, + roaches, using dust mite covers for the bed and the pillows, no smoke in the car or at home.  She works as an Engineering geologist.  + HEPA filter in the home.  [place]   ROS:  All other systems negative except as noted per HPI.  Objective:  Blood pressure 136/82, pulse 77, temperature 97.9 F (36.6 C), temperature source Temporal, resp. rate 20, height 5' 4.75" (1.645 m), weight 180 lb 9.6 oz (81.9 kg), SpO2 100%. Body mass index is 30.29 kg/m. Physical Exam:  General Appearance:  Alert, cooperative, no distress, appears stated age  Head:  Normocephalic, without obvious abnormality, atraumatic  Eyes:  Conjunctiva clear, EOM's intact  Ears EACs normal bilaterally and normal TMs bilaterally  Nose: Nares normal, hypertrophic turbinates, normal mucosa, and no visible anterior polyps  Throat: Lips, tongue normal; teeth and gums normal, normal posterior oropharynx  Neck: Supple, symmetrical  Lungs:   clear to auscultation bilaterally, Respirations unlabored, no coughing  Heart:  regular rate and rhythm and no murmur, Appears well perfused  Extremities: No edema   Skin: Skin color, texture, turgor normal and no rashes or lesions on visualized portions of skin  Neurologic: No gross deficits   Diagnostics: Skin Testing: Environmental allergy panel. Adequate positive and negative controls. Results discussed with patient/family.  Airborne Adult Perc - 08/14/23 1017     Time Antigen Placed 1010    Allergen Manufacturer Waynette Buttery    Location Back    Number of Test 55    1. Control-Buffer 50% Glycerol Negative    2. Control-Histamine 3+    3. Bahia Negative    4. French Southern Territories Negative    5. Johnson Negative    6. [place] Blue Negative    7. Meadow Fescue Negative    8. Perennial Rye Negative    9. Timothy Negative    10. Ragweed Mix Negative    11. Cocklebur Negative    12. Plantain,  English Negative    13. Baccharis Negative    14. Dog Fennel Negative    15. Russian Thistle Negative    16. Lamb's Quarters Negative    17. Sheep Sorrell Negative    18. Rough Pigweed  Negative    19. Marsh Elder, Rough Negative    20. Mugwort, Common Negative    21. Box, Elder Negative    22. Cedar, red Negative    23. Sweet Gum Negative    24. Pecan Pollen Negative    25. Pine Mix Negative    26. Walnut, Black Pollen Negative    27. Red Mulberry Negative    28. Ash Mix Negative    29. Birch Mix Negative    30. Beech American Negative    31. Cottonwood, Guinea-Bissau Negative    32. Hickory, White Negative    33. Maple Mix Negative    34. Oak, [place] Mix Negative    35. Sycamore Eastern Negative    36. Alternaria Alternata Negative    37. Cladosporium Herbarum Negative    38. Aspergillus Mix Negative    39. Penicillium Mix Negative    40. Bipolaris Sorokiniana (Helminthosporium) Negative    41. Drechslera Spicifera (Curvularia) Negative    42. Mucor Plumbeus Negative    43. Fusarium Moniliforme Negative    44. Aureobasidium Pullulans (pullulara) Negative    45. Rhizopus Oryzae Negative    46. Botrytis Cinera Negative    47. Epicoccum Nigrum Negative     48. Phoma Betae Negative    49. Dust Mite Mix Negative    50. Cat Hair 10,000 BAU/ml Negative    51.  Dog Epithelia Negative    52. Mixed Feathers Negative    53. Horse Epithelia Negative    54. Cockroach, German Negative    55. Tobacco Leaf Negative             Intradermal - 08/14/23 1110     Time Antigen Placed 1030    Location Arm    Number of Test 16    Intradermal Select    Control 3+    Bahia Negative    French Southern Territories Negative    Johnson Negative    7 Grass Negative    Ragweed Mix Negative    Weed Mix Negative    Tree Mix Negative    Mold 1 Negative    Mold 2 Negative    Mold 3 Negative    Mold 4 Negative    Mite Mix Negative    Cat Negative    Dog Negative    Cockroach Negative             Allergy testing results were read and interpreted by myself, documented by clinical staff.  Labs:  Lab Orders  No laboratory test(s) ordered today     Assessment and Plan  Assessment and Plan    Possible Mold Exposure with symptoms of chronic cough, rhinitis and migraines Reports of living in an apartment with confirmed mold presence. Experiencing daily headaches, morning cough, and fatigue for several months. Concerns about the impact of mold exposure on health. Moving to a new apartment. -Perform allergy testing today for Cladosporium and Curvularia, as these molds were identified in the apartment. Testing was negative on both skin and intradermal testing. -Entire environmental panel including other molds, pollens, and year-round allergens were also negative - consider taking a pepcid 20 mg nightly to see if this helps with morning cough  Eczema Reports of a dry patch on the right elbow, diagnosed as eczema in adulthood. No current topical treatment. -Prescribe triamcinolone 0.1% for application twice daily to the affected area.  Migraines History of migraines, which have increased in frequency since moving into the current apartment. Previous concussion in  2019. -Continue current migraine management plan. -Follow-up with PCP regarding migraines, consider neurology referral.  Latex Allergy-Contact Dermatitis History of latex allergy resulting in skin reactions. No current treatment needed. -Continue to avoid latex products.   Follow up : as needed It was a pleasure meeting you in clinic today! Thank you for allowing me to participate in your care.  Tonny Bollman, MD [facility]      This note in its entirety was forwarded to the Provider who requested this consultation.  Other: none  Thank you for your kind referral. I appreciate the opportunity to take part in [facility] care. Please do not hesitate to contact me with questions.  Sincerely,  Tonny Bollman, MD [facility]

## 2023-08-14 NOTE — Patient Instructions (Addendum)
Possible Mold Exposure Reports of living in an apartment with confirmed mold presence. Experiencing daily headaches, morning cough, and fatigue for several months. Concerns about the impact of mold exposure on health. Moving to a new apartment. -Perform allergy testing today for Cladosporium and Curvularia, as these molds were identified in the apartment. Testing was negative on both skin and intradermal testing. -Entire environmental panel including other molds, pollens, and year-round allergens were also negative - consider taking a pepcid 20 mg nightly to see if this helps with morning cough  Eczema Reports of a dry patch on the right elbow, diagnosed as eczema in adulthood. No current topical treatment. -Prescribe triamcinolone 0.1% for application twice daily to the affected area.  Migraines History of migraines, which have increased in frequency since moving into the current apartment. Previous concussion in 2019. -Continue current migraine management plan. -Follow-up with PCP regarding migraines, consider neurology referral.  Latex Allergy-Contact Dermatitis History of latex allergy resulting in skin reactions. No current treatment needed. -Continue to avoid latex products.   Follow up : as needed It was a pleasure meeting you in clinic today! Thank you for allowing me to participate in your care.  Tonny Bollman, MD [facility]

## 2023-08-14 NOTE — Addendum Note (Signed)
Addended by: Lynnae Sandhoff, Clovis Mankins E on: 08/14/2023 01:49 PM   Modules accepted: Orders

## 2023-08-30 ENCOUNTER — Other Ambulatory Visit: Payer: Self-pay

## 2023-08-30 ENCOUNTER — Emergency Department (HOSPITAL_COMMUNITY)
Admission: EM | Admit: 2023-08-30 | Discharge: 2023-08-30 | Disposition: A | Discharge status: Home / Self Care | Payer: BC Managed Care – PPO | Attending: Emergency Medicine | Admitting: Emergency Medicine

## 2023-08-30 DIAGNOSIS — S161XXA Strain of muscle, fascia and tendon at neck level, initial encounter: Secondary | ICD-10-CM | POA: Diagnosis not present

## 2023-08-30 DIAGNOSIS — Y92410 Unspecified street and highway as the place of occurrence of the external cause: Secondary | ICD-10-CM | POA: Diagnosis not present

## 2023-08-30 DIAGNOSIS — Z91040 Latex allergy status: Secondary | ICD-10-CM | POA: Diagnosis not present

## 2023-08-30 DIAGNOSIS — S199XXA Unspecified injury of neck, initial encounter: Secondary | ICD-10-CM | POA: Diagnosis present

## 2023-08-30 MED ORDER — CYCLOBENZAPRINE HCL 10 MG PO TABS: 10.0000 mg | ORAL_TABLET | Freq: Two times a day (BID) | ORAL | 0 refills | Status: AC | PRN

## 2023-08-30 MED ORDER — IBUPROFEN 600 MG PO TABS
600.0000 mg | ORAL_TABLET | Freq: Four times a day (QID) | ORAL | 0 refills | Status: AC | PRN
Start: 2023-08-30 — End: ?
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
## 2024-06-02 ENCOUNTER — Emergency Department (HOSPITAL_COMMUNITY)

## 2024-06-02 ENCOUNTER — Emergency Department (HOSPITAL_COMMUNITY)
Admission: EM | Admit: 2024-06-02 | Discharge: 2024-06-02 | Disposition: A | Discharge status: Home / Self Care | Attending: Emergency Medicine | Admitting: Emergency Medicine

## 2024-06-02 ENCOUNTER — Encounter (HOSPITAL_COMMUNITY): Payer: Self-pay

## 2024-06-02 ENCOUNTER — Other Ambulatory Visit: Payer: Self-pay

## 2024-06-02 DIAGNOSIS — Y92410 Unspecified street and highway as the place of occurrence of the external cause: Secondary | ICD-10-CM | POA: Diagnosis not present

## 2024-06-02 DIAGNOSIS — Z91040 Latex allergy status: Secondary | ICD-10-CM | POA: Diagnosis not present

## 2024-06-02 DIAGNOSIS — M542 Cervicalgia: Secondary | ICD-10-CM | POA: Diagnosis present

## 2024-06-02 DIAGNOSIS — S161XXA Strain of muscle, fascia and tendon at neck level, initial encounter: Secondary | ICD-10-CM | POA: Insufficient documentation

## 2024-06-02 MED ORDER — METHOCARBAMOL 500 MG PO TABS: 500.0000 mg | ORAL_TABLET | Freq: Two times a day (BID) | ORAL | 0 refills | Status: AC | PRN
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
## 2024-11-11 DIAGNOSIS — Z1231 Encounter for screening mammogram for malignant neoplasm of breast: Secondary | ICD-10-CM

## 2024-11-27 ENCOUNTER — Ambulatory Visit
Admission: RE | Admit: 2024-11-27 | Discharge: 2024-11-27 | Disposition: A | Discharge status: Home / Self Care | Source: Ambulatory Visit | Attending: Obstetrics and Gynecology | Admitting: Obstetrics and Gynecology

## 2024-11-27 DIAGNOSIS — Z1231 Encounter for screening mammogram for malignant neoplasm of breast: Secondary | ICD-10-CM
